# Patient Record
Sex: Female | Born: 1970 | Race: White | Hispanic: No | Marital: Married | Smoking: Never smoker
Health system: Southern US, Community
[De-identification: ages and names within clinical notes are randomized; demographics above are authoritative.]

## PROBLEM LIST (undated history)

## (undated) DIAGNOSIS — E669 Obesity, unspecified: Secondary | ICD-10-CM

## (undated) DIAGNOSIS — M1990 Unspecified osteoarthritis, unspecified site: Secondary | ICD-10-CM

## (undated) DIAGNOSIS — F32A Depression, unspecified: Secondary | ICD-10-CM

## (undated) DIAGNOSIS — K219 Gastro-esophageal reflux disease without esophagitis: Secondary | ICD-10-CM

## (undated) DIAGNOSIS — T4145XA Adverse effect of unspecified anesthetic, initial encounter: Secondary | ICD-10-CM

## (undated) DIAGNOSIS — T8859XA Other complications of anesthesia, initial encounter: Secondary | ICD-10-CM

## (undated) DIAGNOSIS — K5900 Constipation, unspecified: Secondary | ICD-10-CM

## (undated) DIAGNOSIS — N939 Abnormal uterine and vaginal bleeding, unspecified: Secondary | ICD-10-CM

## (undated) DIAGNOSIS — F329 Major depressive disorder, single episode, unspecified: Secondary | ICD-10-CM

## (undated) HISTORY — PX: ENDOMETRIAL ABLATION: SHX621

## (undated) HISTORY — PX: CHOLECYSTECTOMY: SHX55

## (undated) HISTORY — DX: Depression, unspecified: F32.A

## (undated) HISTORY — PX: TUBAL LIGATION: SHX77

## (undated) HISTORY — PX: ABDOMINOPLASTY: SUR9

## (undated) HISTORY — PX: GASTRIC BYPASS: SHX52

## (undated) HISTORY — PX: BREAST BIOPSY: SHX20

## (undated) HISTORY — PX: CORRECTION HAMMER TOE: SUR317

## (undated) HISTORY — PX: AUGMENTATION MAMMAPLASTY: SUR837

---
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
## 1998-08-29 ENCOUNTER — Other Ambulatory Visit: Admission: RE | Admit: 1998-08-29 | Discharge: 1998-08-29 | Payer: Self-pay | Admitting: Obstetrics and Gynecology

## 2000-10-10 ENCOUNTER — Emergency Department (HOSPITAL_COMMUNITY): Admission: EM | Admit: 2000-10-10 | Discharge: 2000-10-10 | Payer: Self-pay

## 2001-07-19 ENCOUNTER — Emergency Department (HOSPITAL_COMMUNITY): Admission: EM | Admit: 2001-07-19 | Discharge: 2001-07-19 | Payer: Self-pay | Admitting: Emergency Medicine

## 2002-05-31 ENCOUNTER — Other Ambulatory Visit: Admission: RE | Admit: 2002-05-31 | Discharge: 2002-05-31 | Payer: Self-pay | Admitting: Obstetrics and Gynecology

## 2004-01-27 ENCOUNTER — Other Ambulatory Visit: Admission: RE | Admit: 2004-01-27 | Discharge: 2004-01-27 | Payer: Self-pay | Admitting: Obstetrics and Gynecology

## 2004-02-16 ENCOUNTER — Inpatient Hospital Stay (HOSPITAL_COMMUNITY): Admission: AD | Admit: 2004-02-16 | Discharge: 2004-02-16 | Payer: Self-pay | Admitting: Obstetrics and Gynecology

## 2004-02-16 ENCOUNTER — Encounter (INDEPENDENT_AMBULATORY_CARE_PROVIDER_SITE_OTHER): Payer: Self-pay | Admitting: Specialist

## 2004-02-16 ENCOUNTER — Ambulatory Visit (HOSPITAL_COMMUNITY): Admission: RE | Admit: 2004-02-16 | Discharge: 2004-02-16 | Payer: Self-pay | Admitting: Obstetrics and Gynecology

## 2004-02-18 ENCOUNTER — Inpatient Hospital Stay (HOSPITAL_COMMUNITY): Admission: AD | Admit: 2004-02-18 | Discharge: 2004-02-18 | Payer: Self-pay | Admitting: Obstetrics and Gynecology

## 2004-08-09 ENCOUNTER — Ambulatory Visit (HOSPITAL_COMMUNITY): Admission: RE | Admit: 2004-08-09 | Discharge: 2004-08-09 | Payer: Self-pay | Admitting: Obstetrics and Gynecology

## 2005-02-08 ENCOUNTER — Emergency Department (HOSPITAL_COMMUNITY): Admission: EM | Admit: 2005-02-08 | Discharge: 2005-02-08 | Payer: Self-pay | Admitting: Emergency Medicine

## 2006-10-24 ENCOUNTER — Encounter (INDEPENDENT_AMBULATORY_CARE_PROVIDER_SITE_OTHER): Payer: Self-pay | Admitting: Specialist

## 2006-10-24 ENCOUNTER — Ambulatory Visit (HOSPITAL_COMMUNITY): Admission: RE | Admit: 2006-10-24 | Discharge: 2006-10-24 | Payer: Self-pay | Admitting: Obstetrics and Gynecology

## 2006-10-26 ENCOUNTER — Inpatient Hospital Stay (HOSPITAL_COMMUNITY): Admission: AD | Admit: 2006-10-26 | Discharge: 2006-10-26 | Payer: Self-pay | Admitting: Obstetrics and Gynecology

## 2007-09-01 ENCOUNTER — Inpatient Hospital Stay (HOSPITAL_COMMUNITY): Admission: AD | Admit: 2007-09-01 | Discharge: 2007-09-01 | Payer: Self-pay | Admitting: Obstetrics and Gynecology

## 2011-05-03 NOTE — Op Note (Signed)
Denise Summers, Denise Summers              ACCOUNT NO.:  [id number]   MEDICAL RECORD NO.:  [id number]          PATIENT TYPE:  AMB   LOCATION:  SDC                           FACILITY:  [facility]   PHYSICIAN:  Zenaida Niece, M.D.DATE OF BIRTH:  12-11-1971   DATE OF PROCEDURE:  10/24/2006  DATE OF DISCHARGE:                                 OPERATIVE REPORT   PREOPERATIVE DIAGNOSIS:  Abnormal uterine bleeding and endometrial lesion.   POSTOPERATIVE DIAGNOSIS:  Stenotic cervix with hematometra.   PROCEDURE:  Hysteroscopy with dilation and curettage.   SURGEON:  Lavina Hamman, MD   ANESTHESIA:  General with an LMA and paracervical block.   SPECIMENS:  Endometrial curettings sent to pathology.   ESTIMATED BLOOD LOSS:  Minimal.   COMPLICATIONS:  None.   FINDINGS:  The patient had a significantly stenotic cervix and necrotic  looking material in the endometrial cavity.   PROCEDURE IN DETAIL:  The patient was taken to the operating room and placed  in the dorsal supine position.  General anesthesia was induced, and she was  placed in mobile stirrups.  Perineum and vagina were then prepped and draped  in the usual sterile fashion.  The bladder was drained with a latex free  catheter.  A Graves speculum was inserted in the vagina and the anterior lip  of the cervix grasped with a single-tooth tenaculum.  Paracervical block was  then performed with a total of 16 mL of 2% plain lidocaine.  I attempted to  sound the uterus but was only able to get it to sound to approximately 4-5  cm consistent with cervical length.  I then tried to dilate and could not  get through the cervix.  The hysteroscope was inserted and confirmed  complete stenosis of the internal cervical os.  Small dilators were then  used.  I was able to eventually enter the endometrial cavity.  Inserting the  hysteroscope, I still was not able to get very good visualization.  Further  dilation was performed and still all I could see  with the hysteroscope was  what appeared to be a long tunnel.  Sharp curettage was then performed and  in doing this, apparently I broke through an adhesion and was able to get  old blood out of the uterus.  Hysteroscopy was then performed which revealed  a normal endometrial cavity with necrotic-appearing material, mostly on the  anterior portion.  Most of the lining appeared atrophic.  The hysteroscope  was removed and sharp curettage was performed again with return of a small  amount of tissue and some more old blood.  The single-tooth tenaculum was  removed  and bleeding controlled with pressure.  All instruments were then removed  from the vagina.  The patient was awakened in the operating and taken to the  recovery in stable condition after tolerating the procedure well.  Counts  were correct.      Zenaida Niece, M.D.  Electronically Signed     TDM/MEDQ  D:  10/24/2006  T:  10/24/2006  Job:  [id number]

## 2011-05-03 NOTE — H&P (Signed)
NAME:  Denise Summers, JOHNDROW                        ACCOUNT NO.:  [id number]   MEDICAL RECORD NO.:  [id number]                   PATIENT TYPE:  AMB   LOCATION:  DAY                                  FACILITY:  [facility]   PHYSICIAN:  Zenaida Niece, M.D.             DATE OF BIRTH:  August 04, 1971   DATE OF ADMISSION:  08/09/2004  DATE OF DISCHARGE:                                HISTORY & PHYSICAL   CHIEF COMPLAINT:  Left pelvic pain.   HISTORY OF PRESENT ILLNESS:  This is a 40 year old white female, para 1-0-2-  1, whom I performed a hysteroscopy with D&C and endometrial ablation with  _________device in March 2005.  That was done for abnormal bleeding and  dysmenorrhea.  Since then, she has had some recently pelvic cramping and  very light bleeding which is significantly improved.  However, at the time  of her first surgery she was also complaining of some left pelvic pain.  She  had nothing significant on examination at that point, and it was not felt to  be significant.  However, she continues to have intermittent unpredictable  left lower quadrant pain which is worse with coitus.  There is no associated  fever, chills, nausea, or vomiting.  She does have some urinary frequency  and normal bowel movements.  Due to the persistent left pelvic pain, we are  going to proceed with a diagnostic and possible operative laparoscopy to  evaluate her pelvis.   PAST OBSTETRICAL HISTORY:  1. One elective termination.  2. One spontaneous abortion for which she required a D&C,.  3. One spontaneous vaginal delivery, 7 pounds 14 ounces, without     complications.   PAST MEDICAL HISTORY:  Seizure disorder.  Currently, well controlled on  medications.   PAST SURGICAL HISTORY:  1. Laparoscopic cholecystectomy.  2. D&C.  3. Tubal ligation.  4. Hand surgery.  5. Recent hysteroscopy with endometrial ablation.   ALLERGIES:  DILANTIN.   CURRENT MEDICATIONS:  1. Zonegran.  2. Vicodin.   SOCIAL  HISTORY:  The patient is single and denies alcohol, tobacco, or drug  use.   FAMILY HISTORY:  No GYN or colon cancer.   PHYSICAL EXAMINATION:  VITAL SIGNS:  Weight is approximately 250 pounds,  vitals are stable.  GENERAL:  She is an obese white female in no acute distress.  NECK:  Supple without lymphadenopathy or thyromegaly.  LUNGS:  Clear to auscultation.  HEART:  Regular rate and rhythm without murmur.  ABDOMEN:  Benign.  Slightly tender bilateral lower quadrants without  palpable masses, and she does have laparoscopic scars.  PELVIC:  Bimanual examination shows a small mid-plantar to anteverted uterus  which is slightly tender, and both adnexa are slightly tender, left greater  than right.  EXTREMITIES:  No edema, are nontender.   ASSESSMENT:  Persistent left pelvic pain.  All medical and surgical options  have been discussed with the patient, and  she wishes to proceed with  laparoscopic evaluation.  Risks of surgery, including bleeding, infection,  and damage of the surrounding organs have been discussed with the patient.  She also understands that I may not find a reason for her pain.   PLAN:  Admit the patient for an open laparoscopy with possible adhesiolysis  or fulguration of endometriosis.                                               Zenaida Niece, M.D.    TDM/MEDQ  D:  08/08/2004  T:  08/08/2004  Job:  [id number]

## 2011-05-03 NOTE — H&P (Signed)
NAME:  Denise Summers, Denise Summers                        ACCOUNT NO.:  [id number]   MEDICAL RECORD NO.:  [id number]                   PATIENT TYPE:  AMB   LOCATION:  SDC                                  FACILITY:  [facility]   PHYSICIAN:  Zenaida Niece, M.D.             DATE OF BIRTH:  03-16-1971   DATE OF ADMISSION:  DATE OF DISCHARGE:                                HISTORY & PHYSICAL   CHIEF COMPLAINT:  Abnormal uterine bleeding and painful periods.   HISTORY OF PRESENT ILLNESS:  This is a 39 year old white female para 1, 0,  2, 1 whom I saw for an annual exam February 11th. At that time, she reported  regular menses but increased bleeding and pain with her last period which  was also very short.  Advil did not help the discomfort.  On exam, uterus  was anteverted with a possible left fundal myoma and was nontender.  She had  an ultrasound performed on February 16th which revealed a slightly large  uterus which 2 small myomas which did not appear to be submucosal. She had a  single right ovarian cyst and a simple left ovarian cyst.   Options had been discussed with the patient and she wishes to proceed with  hysteroscopy with D&C and endometrial ablation.   The patient is currently requiring Vicodin to control her pain and is also  on Provera to control her bleeding.   OBSTETRICAL HISTORY:  One elective termination, one spontaneous abortion for  which she required a D&C and 1 spontaneous vaginal delivery 7 pounds, 14  ounces without complications.   PAST MEDICAL HISTORY:  Seizure disorder which is currently well controlled  on medications.   PAST SURGICAL HISTORY:  Laparoscopic cholecystectomy December 2004.  D&C.  Also tubal ligation in 1996 with laparoscopy. Hand surgery.   ALLERGIES:  DILANTIN.   CURRENT MEDICATIONS:  Zonegran but was unable to give me the dose, Vicodin  p.r.n. and Provera 10 mg a day.   SOCIAL HISTORY:  Patient is single and denies alcohol, tobacco or drug  use.   FAMILY HISTORY:  No GYN or colon cancer.   PHYSICAL EXAMINATION:  VITAL SIGNS:  Weight 245 pounds, blood pressure  110/90, pulse 80.  GENERAL:  Patient is obese but in no acute distress.  NECK:  Supple without lymphadenopathy or thyromegaly.  LUNGS:  Clear to auscultation.  CARDIOVASCULAR:  Regular rate and rhythm without murmur.  ABDOMEN:  Obese, soft, nontender, nondistended without palpable masses. She  does have laparoscopic scars.  PELVIC:  External genitalia without lesions.  Speculum exam revealed a  normal cervix and Pap smear has returned within normal limits.  Bimanual  exam revealed an anteverted uterus with possible left fundal myoma and is  nontender. She has no adnexal masses.   ASSESSMENT:  Menorrhagia and dysmenorrhea with possible small fibroids by  ultrasound.  These do not appear to be submucosal. All medical and  surgical  options have been discussed with the patient.  Her endometrial stripe was  thickened on the ultrasound measuring 12.3 mm.  Thus, I have recommended  hysteroscopy with D&C and she wishes to proceed with endometrial ablation.   Risks of surgery including bleeding, infection, damage to surrounding organs  including uterine perforation have been discussed.   Plan is to admit the patient for a hysteroscopy with D&C followed by  endometrial ablation with the NovaSure device.                                               Zenaida Niece, M.D.    TDM/MEDQ  D:  02/15/2004  T:  02/15/2004  Job:  [id number]

## 2011-05-03 NOTE — Op Note (Signed)
NAME:  Denise Summers, Denise Summers                        ACCOUNT NO.:  [id number]   MEDICAL RECORD NO.:  [id number]                   PATIENT TYPE:  AMB   LOCATION:  DAY                                  FACILITY:  [facility]   PHYSICIAN:  Zenaida Niece, M.D.             DATE OF BIRTH:  27-Jul-1971   DATE OF PROCEDURE:  08/09/2004  DATE OF DISCHARGE:                                 OPERATIVE REPORT   PREOPERATIVE DIAGNOSIS:  Pelvic pain.   POSTOPERATIVE DIAGNOSES:  Pelvic pain and minimal pelvic adhesions.   PROCEDURE:  Open laparoscopy with minimal adhesiolysis.   SURGEON:  Lavina Hamman, M.D.   ANESTHESIA:  General endotracheal tube.   ESTIMATED BLOOD LOSS:  Less than 50 mL.   FINDINGS:  She had normal pelvic anatomy with evidence of prior tubal  ligation.  There were some filmy sigmoid adhesions to the left  pelvic/abdominal wall.  There was one twisted and necrotic appendiceal  epiploica on the sigmoid colon.  Appendix was normal.  Liver edge was also  normal.   PROCEDURE IN DETAIL:  The patient was taken to the operating room and placed  in the dorsal supine position.  General anesthesia was induced, and she was  placed in mobile stirrups.  The abdomen was then prepped and draped in the  usual sterile fashion, bladder drained with a red rubber catheter, Hulka  tenaculum applied to the cervix for uterine manipulation.  Infraumbilical  skin was then infiltrated with 0.25% Marcaine, and a 3 cm horizontal  incision was made along her previous scar.  Dissection was carried out down  to the fascia which was elevated and entered sharply.  This was extended,  and I was then able to enter the peritoneal cavity bluntly.  Sweeping with a  finger, I felt no significant adhesions.  A pursestring suture of 0 Vicryl  was placed around the fascial edge and secured to a Hasson cannula which was  inserted through the incision.  CO2 gas was insufflated, and the patient was  placed in Trendelenburg.   The laparoscope was introduced.  A 5 mm port was  then placed low in the midline under direct visualization.  Inspection  revealed the above-mentioned findings.  Normal tubes and ovaries with  evidence of prior tubal ligation.  Uterus was slightly enlarged but  otherwise normal.  There was no evidence of significant pelvic pathology  including no evidence of endometriosis or significant pelvic adhesions or  infection.  Some filmy adhesions of the sigmoid colon to the left pelvic  abdominal wall were taken down with sharp and blunt dissection.  There was  noted to be a twisted necrotic appendiceal epiploica on the left side.  Appendix appeared normal, and liver edge was normal.  No further source for  her pain was identified.  The 5 mm port was then removed under direct  visualization.  The laparoscope was removed followed by the umbilical  trocar  and all gas allowed to deflate from the abdomen.  The previously placed  pursestring suture was tied, and this achieved good fascial integrity.  Skin  incisions were closed with subcuticular sutures of 4-0 Vicryl followed by  Steri-Strips and Band-Aids.  The Hulka  tenaculum was then removed from the cervix.  The patient tolerated the  procedure well and was taken to the recovery room in stable condition.  Counts were correct.  She received no antibiotics.  She had PAS hose on  throughout the procedure.                                               Zenaida Niece, M.D.    TDM/MEDQ  D:  08/09/2004  T:  08/09/2004  Job:  [id number]

## 2011-05-03 NOTE — Op Note (Signed)
NAME:  Denise Summers, Denise Summers                        ACCOUNT NO.:  [id number]   MEDICAL RECORD NO.:  [id number]                   PATIENT TYPE:  AMB   LOCATION:  SDC                                  FACILITY:  [facility]   PHYSICIAN:  Zenaida Niece, M.D.             DATE OF BIRTH:  [birth year]   DATE OF PROCEDURE:  02/16/2004  DATE OF DISCHARGE:                                 OPERATIVE REPORT   DIAGNOSES:  1. Abnormal uterine bleeding.  2. Dysmenorrhea.   PROCEDURE:  Hysteroscopy with dilation and curettage and endometrial  ablation with the NovaSure device.   SURGEON:  Zenaida Niece, M.D.   ANESTHESIA:  Monitored anesthesia care and paracervical block.   ESTIMATED BLOOD LOSS:  Less than 50 mL.   FLUID DEFICIT THROUGH THE HYSTEROSCOPE:  Approximately 30 mL.   FINDINGS:  She had a shaggy endometrium but otherwise normal endometrial  cavity.  Curettage did reveal moderate tissue.  With the NovaSure device,  the uterus measured 5 cm in length, 4.5 cm in width, and used under 24 watts  for 58 seconds.   PROCEDURE IN DETAIL:  The patient was taken to the operating room and placed  in the dorsal supine position.  She was given IV sedation and placed in  mobile stirrups.  Perineum and vagina were prepped and draped in the usual  sterile fashion and bladder drained with a red rubber catheter.  Graves  speculum was inserted in the vagina, and the anterior lip of the cervix  grasped with a single-tooth tenaculum.  Paracervical block was then  performed with 10 mL of 2% lidocaine.  The uterus sounded to 9.5 cm; cervix  measured 4.5 cm.  The cervix was then gradually dilated to a size 23  dilator.  The observer hysteroscope was inserted, and adequate visualization  was achieved.  There were no specific lesions other than shaggy endometrium.  The uterine cornu and fundus appeared normal.  The hysteroscope was removed,  and sharp curettage performed with return of a moderate amount of tissue.  The NovaSure device was then prepared appropriately and adjusted to 5 cm  uterine length.  It was then inserted and deployed appropriately.  It had  passed the CO2 test, and endometrial ablation was performed with the above-  mentioned settings without complications.  The device was removed intact.  Single-tooth tenaculum was removed and bleeding controlled with pressure.  All instruments were then removed from the vagina.  The patient was awakened  in the operating room, tolerated the procedure well, and was taken to the  recovery room in stable condition.  Counts were correct x 2, and she  received no antibiotics.  Fredderick Erb with the NovaSure device was present  throughout the endometrial ablation.  Zenaida Niece, M.D.    TDM/MEDQ  D:  02/16/2004  T:  02/16/2004  Job:  [id number]

## 2011-09-26 LAB — URINALYSIS, ROUTINE W REFLEX MICROSCOPIC
Glucose, UA: NEGATIVE
Ketones, ur: NEGATIVE
Leukocytes, UA: NEGATIVE
Protein, ur: NEGATIVE
Urobilinogen, UA: 0.2

## 2011-09-26 LAB — CBC
Hemoglobin: 13.7
MCV: 95.4
RBC: 4.26
WBC: 6.4

## 2011-09-26 LAB — URINE MICROSCOPIC-ADD ON

## 2012-01-08 ENCOUNTER — Other Ambulatory Visit: Payer: Self-pay | Admitting: Obstetrics and Gynecology

## 2012-01-08 DIAGNOSIS — R928 Other abnormal and inconclusive findings on diagnostic imaging of breast: Secondary | ICD-10-CM

## 2012-01-24 ENCOUNTER — Ambulatory Visit
Admission: RE | Admit: 2012-01-24 | Discharge: 2012-01-24 | Disposition: A | Discharge status: Home / Self Care | Payer: 59 | Source: Ambulatory Visit | Attending: Obstetrics and Gynecology | Admitting: Obstetrics and Gynecology

## 2012-01-24 DIAGNOSIS — R928 Other abnormal and inconclusive findings on diagnostic imaging of breast: Secondary | ICD-10-CM

## 2013-04-20 ENCOUNTER — Encounter (HOSPITAL_BASED_OUTPATIENT_CLINIC_OR_DEPARTMENT_OTHER): Payer: Self-pay

## 2013-04-20 ENCOUNTER — Emergency Department (HOSPITAL_BASED_OUTPATIENT_CLINIC_OR_DEPARTMENT_OTHER)
Admission: EM | Admit: 2013-04-20 | Discharge: 2013-04-20 | Discharge status: Against Medical Advice | Payer: BC Managed Care – PPO | Attending: Emergency Medicine | Admitting: Emergency Medicine

## 2013-04-20 DIAGNOSIS — IMO0002 Reserved for concepts with insufficient information to code with codable children: Secondary | ICD-10-CM | POA: Insufficient documentation

## 2013-04-20 DIAGNOSIS — Y92410 Unspecified street and highway as the place of occurrence of the external cause: Secondary | ICD-10-CM | POA: Insufficient documentation

## 2013-04-20 DIAGNOSIS — Y9389 Activity, other specified: Secondary | ICD-10-CM | POA: Insufficient documentation

## 2013-04-20 HISTORY — DX: Obesity, unspecified: E66.9

## 2013-04-20 NOTE — ED Notes (Signed)
Pt states that she has been waiting more than 30 minutes and that this is unacceptable.  Pt states that she cannot "believe that I have been sitting here this long and no one has come into my room."  Pt was informed that she would be seen shortly, however, since this is an emergency department the sickest patients are the ones who are seen first.  She was advised that the physician should be with her in an estimated 10-20 minutes provided that no one else needed emergent attention.  The patient was asked for her patience and cooperation, however she has decided to leave the department without being seen.
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
## 2013-12-10 ENCOUNTER — Encounter (HOSPITAL_COMMUNITY): Payer: Self-pay | Admitting: Emergency Medicine

## 2013-12-10 ENCOUNTER — Emergency Department (HOSPITAL_COMMUNITY)
Admission: EM | Admit: 2013-12-10 | Discharge: 2013-12-10 | Disposition: A | Discharge status: Home / Self Care | Payer: Managed Care, Other (non HMO) | Attending: Emergency Medicine | Admitting: Emergency Medicine

## 2013-12-10 DIAGNOSIS — R6883 Chills (without fever): Secondary | ICD-10-CM | POA: Insufficient documentation

## 2013-12-10 DIAGNOSIS — B9789 Other viral agents as the cause of diseases classified elsewhere: Secondary | ICD-10-CM | POA: Insufficient documentation

## 2013-12-10 DIAGNOSIS — R197 Diarrhea, unspecified: Secondary | ICD-10-CM

## 2013-12-10 DIAGNOSIS — B349 Viral infection, unspecified: Secondary | ICD-10-CM

## 2013-12-10 DIAGNOSIS — Z79899 Other long term (current) drug therapy: Secondary | ICD-10-CM | POA: Insufficient documentation

## 2013-12-10 DIAGNOSIS — J3489 Other specified disorders of nose and nasal sinuses: Secondary | ICD-10-CM | POA: Insufficient documentation

## 2013-12-10 DIAGNOSIS — R109 Unspecified abdominal pain: Secondary | ICD-10-CM | POA: Insufficient documentation

## 2013-12-10 DIAGNOSIS — Z9851 Tubal ligation status: Secondary | ICD-10-CM | POA: Insufficient documentation

## 2013-12-10 DIAGNOSIS — E669 Obesity, unspecified: Secondary | ICD-10-CM | POA: Insufficient documentation

## 2013-12-10 DIAGNOSIS — R5381 Other malaise: Secondary | ICD-10-CM | POA: Insufficient documentation

## 2013-12-10 DIAGNOSIS — Z9889 Other specified postprocedural states: Secondary | ICD-10-CM | POA: Insufficient documentation

## 2013-12-10 DIAGNOSIS — R110 Nausea: Secondary | ICD-10-CM | POA: Insufficient documentation

## 2013-12-10 DIAGNOSIS — Z9089 Acquired absence of other organs: Secondary | ICD-10-CM | POA: Insufficient documentation

## 2013-12-10 MED ORDER — ONDANSETRON HCL 4 MG PO TABS: 4.0000 mg | ORAL_TABLET | Freq: Four times a day (QID) | ORAL | Status: DC

## 2013-12-10 MED ORDER — ONDANSETRON 4 MG PO TBDP
4.0000 mg | ORAL_TABLET | Freq: Once | ORAL | Status: AC
  Administered 2013-12-10: 4 mg via ORAL
  Filled 2013-12-10: qty 1

## 2013-12-10 MED ORDER — DIPHENOXYLATE-ATROPINE 2.5-0.025 MG PO TABS
1.0000 | ORAL_TABLET | Freq: Once | ORAL | Status: AC
  Administered 2013-12-10: 1 via ORAL
  Filled 2013-12-10: qty 1

## 2013-12-10 MED ORDER — DIPHENOXYLATE-ATROPINE 2.5-0.025 MG PO TABS: 1.0000 | ORAL_TABLET | Freq: Four times a day (QID) | ORAL | Status: DC | PRN

## 2013-12-10 NOTE — ED Notes (Signed)
Pt states diarrhea x 2 days, denies vomiting, abdominal pain, states feels weak

## 2013-12-10 NOTE — ED Provider Notes (Signed)
CSN: [id number]     Arrival date & time 12/10/13  1235 History   First MD Initiated Contact with Patient 12/10/13 1435     Chief Complaint  Patient presents with  . Diarrhea   (Consider location/radiation/quality/duration/timing/severity/associated sxs/prior Treatment) HPI Comments: Patient is a 42 year old obese female who presents to the emergency department complaining of diarrhea x2 days. Diarrhea is nonbloody, nonbilious. She has had multiple episodes of diarrhea. Admits to associated weakness, chills, congestion nausea without vomiting. 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 She feels fun down. Denies abdominal pain, vomiting, fever, cough, sob.   Patient is a 42 y.o. female presenting with diarrhea. The history is provided by the patient.  Diarrhea Associated symptoms: chills     Past Medical History  Diagnosis Date  . Obesity    Past Surgical History  Procedure Laterality Date  . Cholecystectomy    . Tubal ligation    . Endometrial ablation     History reviewed. No pertinent family history. History  Substance Use Topics  . Smoking status: Never Smoker   . Smokeless tobacco: Never Used  . Alcohol Use: No   OB History   Grav Para Term Preterm Abortions TAB SAB Ect Mult Living                 Review of Systems  Constitutional: Positive for chills.  HENT: Positive for congestion.   Gastrointestinal: Positive for nausea and diarrhea.  Neurological: Positive for weakness.  All other systems reviewed and are negative.    Allergies  Review of patient's allergies indicates no known allergies.  Home Medications   Current Outpatient Rx  Name  Route  Sig  Dispense  Refill  . acetaminophen (TYLENOL) 500 MG tablet   Oral   Take 1,000 mg by mouth every 6 (six) hours as needed.         . diphenoxylate-atropine (LOMOTIL) 2.5-0.025 MG per tablet   Oral   Take 1 tablet by mouth 4 (four) times daily as needed for diarrhea or loose  stools.   15 tablet   0   . ondansetron (ZOFRAN) 4 MG tablet   Oral   Take 1 tablet (4 mg total) by mouth every 6 (six) hours.   12 tablet   0    BP 122/62  Temp(Src) 98.2 F (36.8 C) (Oral)  Resp 18  SpO2 100% Physical Exam  Nursing note and vitals reviewed. Constitutional: She is oriented to person, place, and time. She appears well-developed and well-nourished. No distress.  HENT:  Head: Normocephalic and atraumatic.  Mouth/Throat: Oropharynx is clear and moist.  Eyes: Conjunctivae are normal. No scleral icterus.  Neck: Normal range of motion. Neck supple.  Cardiovascular: Normal rate, regular rhythm and normal heart sounds.   Pulmonary/Chest: Effort normal and breath sounds normal.  Abdominal: Soft. Bowel sounds are normal. She exhibits no distension and no mass. There is tenderness. There is no rebound and no guarding.  Mild tenderness to lower abdomen, no peritoneal signs.  Musculoskeletal: Normal range of motion. She exhibits no edema.  Neurological: She is alert and oriented to person, place, and time.  Skin: Skin is warm and dry. She is not diaphoretic.  Psychiatric: She has a normal mood and affect. Her behavior is normal.    ED Course  Procedures (including critical care time) Labs Review Labs Reviewed - No data to display Imaging Review No results found.  EKG Interpretation   None  MDM   1. Diarrhea   2. Viral syndrome    Patient presenting with diarrhea, generalized weakness, congestion and nausea. She is well appearing and in no apparent distress, normal vital signs. 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 Abdomen has mild tenderness, soft, no peritoneal signs. No recent antibiotic use. Will treat symptoms. She is stable for discharge. Return precautions given. Patient states understanding of treatment care plan and is agreeable.     Trevor Mace, PA-C 12/10/13 [phone]

## 2013-12-12 ENCOUNTER — Encounter (HOSPITAL_COMMUNITY): Payer: Self-pay | Admitting: Emergency Medicine

## 2013-12-12 ENCOUNTER — Emergency Department (HOSPITAL_COMMUNITY)
Admission: EM | Admit: 2013-12-12 | Discharge: 2013-12-12 | Disposition: A | Discharge status: Home / Self Care | Payer: Managed Care, Other (non HMO) | Attending: Emergency Medicine | Admitting: Emergency Medicine

## 2013-12-12 DIAGNOSIS — J069 Acute upper respiratory infection, unspecified: Secondary | ICD-10-CM | POA: Insufficient documentation

## 2013-12-12 DIAGNOSIS — R5381 Other malaise: Secondary | ICD-10-CM | POA: Insufficient documentation

## 2013-12-12 DIAGNOSIS — E669 Obesity, unspecified: Secondary | ICD-10-CM | POA: Insufficient documentation

## 2013-12-12 MED ORDER — DIPHENHYDRAMINE HCL 25 MG PO CAPS
25.0000 mg | ORAL_CAPSULE | Freq: Once | ORAL | Status: AC
  Administered 2013-12-12: 25 mg via ORAL
  Filled 2013-12-12: qty 1

## 2013-12-12 MED ORDER — KETOROLAC TROMETHAMINE 30 MG/ML IJ SOLN
30.0000 mg | Freq: Once | INTRAMUSCULAR | Status: AC
  Administered 2013-12-12: 30 mg via INTRAMUSCULAR
  Filled 2013-12-12: qty 1

## 2013-12-12 MED ORDER — METOCLOPRAMIDE HCL 5 MG/ML IJ SOLN
10.0000 mg | Freq: Once | INTRAMUSCULAR | Status: AC
  Administered 2013-12-12: 10 mg via INTRAMUSCULAR
  Filled 2013-12-12: qty 2

## 2013-12-12 NOTE — ED Provider Notes (Signed)
Medical screening examination/treatment/procedure(s) were performed by non-physician practitioner and as supervising physician I was immediately available for consultation/collaboration.  EKG Interpretation   None        Ethelda Chick, MD 12/12/13 1207

## 2013-12-12 NOTE — ED Notes (Signed)
Per pt, states flu like symptoms since last Thurs-no more diarrhea although still has congestion, weakness

## 2013-12-12 NOTE — ED Provider Notes (Signed)
CSN: [id number]     Arrival date & time 12/12/13  7829 History   First MD Initiated Contact with Patient 12/12/13 1023     Chief Complaint  Patient presents with  . flu like symptoms    (Consider location/radiation/quality/duration/timing/severity/associated sxs/prior Treatment) HPI Comments: Patient presents again to the emergency department with complaint of headache, fatigue, shortness of breath, mild cough at night, nasal congestion. Patient was seen in emergency department 2 days ago with similar symptoms and diarrhea. At that time, she was given Lomotil which has helped her diarrhea. Other symptoms continue. She denies fever or chest pain. Patient states that she becomes very short of breath even with walking short distances. She denies risk factors for PE. She denies lower extremity swelling. Cough is nonproductive. Headache is generalized. She denies weakness in her arms or her legs, blurry vision, vomiting. Nothing makes the headache better or worse. She did not hit her head. Onset was gradual.  The history is provided by the patient and medical records.    Past Medical History  Diagnosis Date  . Obesity    Past Surgical History  Procedure Laterality Date  . Cholecystectomy    . Tubal ligation    . Endometrial ablation     No family history on file. History  Substance Use Topics  . Smoking status: Never Smoker   . Smokeless tobacco: Never Used  . Alcohol Use: No   OB History   Grav Para Term Preterm Abortions TAB SAB Ect Mult Living                 Review of Systems  Constitutional: Positive for fatigue. Negative for fever and chills.  HENT: Positive for congestion. Negative for dental problem, ear pain, rhinorrhea, sinus pressure and sore throat.   Eyes: Negative for photophobia, discharge, redness and visual disturbance.  Respiratory: Positive for cough and shortness of breath. Negative for wheezing.   Cardiovascular: Negative for chest pain.  Gastrointestinal:  Negative for nausea, vomiting, abdominal pain and diarrhea.  Genitourinary: Negative for dysuria.  Musculoskeletal: Negative for gait problem, myalgias, neck pain and neck stiffness.  Skin: Negative for rash.  Neurological: Positive for headaches. Negative for syncope, speech difficulty, weakness, light-headedness and numbness.  Hematological: Negative for adenopathy.  Psychiatric/Behavioral: Negative for confusion.    Allergies  Review of patient's allergies indicates no known allergies.  Home Medications   Current Outpatient Rx  Name  Route  Sig  Dispense  Refill  . acetaminophen (TYLENOL) 500 MG tablet   Oral   Take 1,000 mg by mouth every 6 (six) hours as needed for mild pain or moderate pain.          . diphenoxylate-atropine (LOMOTIL) 2.5-0.025 MG per tablet   Oral   Take 1 tablet by mouth 4 (four) times daily as needed for diarrhea or loose stools.         . ondansetron (ZOFRAN) 4 MG tablet   Oral   Take 1 tablet (4 mg total) by mouth every 6 (six) hours.   12 tablet   0    BP 119/59  Pulse 70  Temp(Src) 98 F (36.7 C) (Oral)  Resp 14  SpO2 100% Physical Exam  Nursing note and vitals reviewed. Constitutional: She is oriented to person, place, and time. She appears well-developed and well-nourished.  HENT:  Head: Normocephalic and atraumatic.  Right Ear: Tympanic membrane, external ear and ear canal normal.  Left Ear: Tympanic membrane, external ear and ear canal normal.  Nose: Nose normal. No mucosal edema or rhinorrhea.  Mouth/Throat: Uvula is midline, oropharynx is clear and moist and mucous membranes are normal. Mucous membranes are not dry. No oral lesions. No trismus in the jaw. No uvula swelling. No oropharyngeal exudate, posterior oropharyngeal edema, posterior oropharyngeal erythema or tonsillar abscesses.  Eyes: Conjunctivae, EOM and lids are normal. Pupils are equal, round, and reactive to light. Right eye exhibits no discharge. Left eye exhibits no  discharge. Right eye exhibits no nystagmus. Left eye exhibits no nystagmus.  Neck: Normal range of motion. Neck supple.  Cardiovascular: Normal rate, regular rhythm and normal heart sounds.   Pulmonary/Chest: Effort normal and breath sounds normal. No respiratory distress. She has no wheezes. She has no rales.  Abdominal: Soft. There is no tenderness.  Musculoskeletal:       Cervical back: She exhibits normal range of motion, no tenderness and no bony tenderness.  Lymphadenopathy:    She has no cervical adenopathy.  Neurological: She is alert and oriented to person, place, and time. She has normal strength and normal reflexes. No cranial nerve deficit or sensory deficit. She displays a negative Romberg sign. Coordination and gait normal. GCS eye subscore is 4. GCS verbal subscore is 5. GCS motor subscore is 6.  Skin: Skin is warm and dry.  Psychiatric: She has a normal mood and affect.    ED Course  Procedures (including critical care time) Labs Review Labs Reviewed - No data to display Imaging Review No results found.  EKG Interpretation   None      11:03 AM Patient seen and examined. Work-up initiated. Medications ordered. Cup of water given and she is encouraged to hydrate. Will ambulate to ensure she is not desatting with walking.   Vital signs reviewed and are as follows: Filed Vitals:   12/12/13 1003  BP: 119/59  Pulse: 70  Temp: 98 F (36.7 C)  Resp: 14   11:35 AM Patient ambulated with pulse ox and remained at 100%.   12:00 PM Pt feeling a bit better. Patient counseled on supportive care for viral syndrome and s/s to return including worsening symptoms, persistent fever, persistent vomiting, or if they have any other concerns.  Urged to see PCP if symptoms persist for more than 3 days. Patient verbalizes understanding and agrees with plan.     MDM   1. Upper respiratory infection    Patient with symptoms consistent with a probable viral syndrome.  Vitals are  stable, no fever.  No signs of dehydration.  Lung exam normal, no signs of pneumonia.  She does have URI symptoms. Supportive therapy indicated with return if symptoms worsen.  Patient has ambulated without desaturation.   HA: No head injury, gradual onset. Normal neurological exam. Do not suspect intracranial etiology. No fever or neck pain to suggest meningitis. Patient appears well, nontoxic. No indication for imaging at this time.      Renne Crigler, PA-C 12/12/13 1205

## 2013-12-16 NOTE — ED Provider Notes (Signed)
Medical screening examination/treatment/procedure(s) were performed by non-physician practitioner and as supervising physician I was immediately available for consultation/collaboration.  EKG Interpretation   None         Sartaj Hoskin H Mesiah Manzo, MD 12/16/13 1455 

## 2014-05-12 ENCOUNTER — Other Ambulatory Visit: Payer: Self-pay | Admitting: Gastroenterology

## 2014-05-12 DIAGNOSIS — R1310 Dysphagia, unspecified: Secondary | ICD-10-CM

## 2014-05-17 ENCOUNTER — Other Ambulatory Visit: Payer: Managed Care, Other (non HMO)

## 2014-05-23 ENCOUNTER — Encounter (INDEPENDENT_AMBULATORY_CARE_PROVIDER_SITE_OTHER): Payer: Self-pay

## 2014-05-23 ENCOUNTER — Other Ambulatory Visit: Payer: Managed Care, Other (non HMO)

## 2014-05-23 ENCOUNTER — Ambulatory Visit
Admission: RE | Admit: 2014-05-23 | Discharge: 2014-05-23 | Disposition: A | Discharge status: Home / Self Care | Payer: Managed Care, Other (non HMO) | Source: Ambulatory Visit | Attending: Gastroenterology | Admitting: Gastroenterology

## 2014-05-23 DIAGNOSIS — R1310 Dysphagia, unspecified: Secondary | ICD-10-CM

## 2014-06-07 ENCOUNTER — Other Ambulatory Visit: Payer: Self-pay | Admitting: Gastroenterology

## 2014-07-01 ENCOUNTER — Encounter (HOSPITAL_COMMUNITY): Payer: Self-pay | Admitting: *Deleted

## 2014-07-05 ENCOUNTER — Encounter (HOSPITAL_COMMUNITY): Payer: Self-pay | Admitting: Pharmacy Technician

## 2014-07-10 ENCOUNTER — Emergency Department: Payer: Self-pay | Admitting: Emergency Medicine

## 2014-07-14 ENCOUNTER — Ambulatory Visit (HOSPITAL_COMMUNITY)
Admission: RE | Admit: 2014-07-14 | Discharge: 2014-07-14 | Disposition: A | Discharge status: Home / Self Care | Payer: Managed Care, Other (non HMO) | Source: Ambulatory Visit | Attending: Gastroenterology | Admitting: Gastroenterology

## 2014-07-14 ENCOUNTER — Ambulatory Visit (HOSPITAL_COMMUNITY): Payer: Managed Care, Other (non HMO) | Admitting: Anesthesiology

## 2014-07-14 ENCOUNTER — Encounter (HOSPITAL_COMMUNITY): Payer: Managed Care, Other (non HMO) | Admitting: Anesthesiology

## 2014-07-14 ENCOUNTER — Encounter (HOSPITAL_COMMUNITY)
Admission: RE | Disposition: A | Discharge status: Home / Self Care | Payer: Self-pay | Source: Ambulatory Visit | Attending: Gastroenterology

## 2014-07-14 ENCOUNTER — Encounter (HOSPITAL_COMMUNITY): Payer: Self-pay

## 2014-07-14 DIAGNOSIS — R1310 Dysphagia, unspecified: Secondary | ICD-10-CM | POA: Insufficient documentation

## 2014-07-14 DIAGNOSIS — K319 Disease of stomach and duodenum, unspecified: Secondary | ICD-10-CM | POA: Insufficient documentation

## 2014-07-14 DIAGNOSIS — K289 Gastrojejunal ulcer, unspecified as acute or chronic, without hemorrhage or perforation: Secondary | ICD-10-CM | POA: Insufficient documentation

## 2014-07-14 DIAGNOSIS — K219 Gastro-esophageal reflux disease without esophagitis: Secondary | ICD-10-CM | POA: Insufficient documentation

## 2014-07-14 DIAGNOSIS — Z79899 Other long term (current) drug therapy: Secondary | ICD-10-CM | POA: Insufficient documentation

## 2014-07-14 DIAGNOSIS — Z9089 Acquired absence of other organs: Secondary | ICD-10-CM | POA: Insufficient documentation

## 2014-07-14 HISTORY — PX: ESOPHAGOGASTRODUODENOSCOPY (EGD) WITH PROPOFOL: SHX5813

## 2014-07-14 HISTORY — DX: Gastro-esophageal reflux disease without esophagitis: K21.9

## 2014-07-14 SURGERY — ESOPHAGOGASTRODUODENOSCOPY (EGD) WITH PROPOFOL
Anesthesia: Monitor Anesthesia Care

## 2014-07-14 MED ORDER — PROMETHAZINE HCL 25 MG/ML IJ SOLN: 6.2500 mg | INTRAMUSCULAR | Status: DC | PRN

## 2014-07-14 MED ORDER — LACTATED RINGERS IV SOLN
INTRAVENOUS | Status: DC | PRN
  Administered 2014-07-14: 08:00:00 via INTRAVENOUS

## 2014-07-14 MED ORDER — LACTATED RINGERS IV SOLN: INTRAVENOUS | Status: DC

## 2014-07-14 MED ORDER — PROPOFOL 10 MG/ML IV BOLUS
INTRAVENOUS | Status: AC
  Filled 2014-07-14: qty 20

## 2014-07-14 MED ORDER — PROPOFOL 10 MG/ML IV BOLUS
INTRAVENOUS | Status: DC | PRN
  Administered 2014-07-14 (×2): 40 mg via INTRAVENOUS
  Administered 2014-07-14: 20 mg via INTRAVENOUS
  Administered 2014-07-14 (×2): 40 mg via INTRAVENOUS
  Administered 2014-07-14: 20 mg via INTRAVENOUS
  Administered 2014-07-14: 30 mg via INTRAVENOUS

## 2014-07-14 MED ORDER — SODIUM CHLORIDE 0.9 % IV SOLN: INTRAVENOUS | Status: DC

## 2014-07-14 MED ORDER — PROPOFOL INFUSION 10 MG/ML OPTIME
INTRAVENOUS | Status: DC | PRN
  Administered 2014-07-14: 100 ug/kg/min via INTRAVENOUS

## 2014-07-14 MED ORDER — LACTATED RINGERS IV SOLN
INTRAVENOUS | Status: DC
  Administered 2014-07-14: 1000 mL via INTRAVENOUS

## 2014-07-14 SURGICAL SUPPLY — 14 items

## 2014-07-14 NOTE — Transfer of Care (Signed)
Immediate Anesthesia Transfer of Care Note  Patient: Denise MassonSara G Summers  Procedure(s) Performed: Procedure(s) (LRB): ESOPHAGOGASTRODUODENOSCOPY (EGD) WITH PROPOFOL (N/A)  Patient Location: PACU  Anesthesia Type: MAC  Level of Consciousness: sedated, patient cooperative and responds to stimulation  Airway & Oxygen Therapy: Patient Spontanous Breathing and Patient connected to face mask oxgen  Post-op Assessment: Report given to PACU RN and Post -op Vital signs reviewed and stable  Post vital signs: Reviewed and stable  Complications: No apparent anesthesia complications

## 2014-07-14 NOTE — Anesthesia Preprocedure Evaluation (Addendum)
Anesthesia Evaluation  Patient identified by MRN, date of birth, ID band Patient awake    Reviewed: Allergy & Precautions, H&P , NPO status , Patient's Chart, lab work & pertinent test results  Airway Mallampati: II TM Distance: >3 FB Neck ROM: Full    Dental  (+) Teeth Intact, Dental Advisory Given   Pulmonary neg pulmonary ROS,  breath sounds clear to auscultation  Pulmonary exam normal       Cardiovascular negative cardio ROS      Neuro/Psych negative neurological ROS  negative psych ROS   GI/Hepatic negative GI ROS, Neg liver ROS, GERD-  ,  Endo/Other  Morbid obesity  Renal/GU negative Renal ROS  negative genitourinary   Musculoskeletal negative musculoskeletal ROS (+)   Abdominal (+) + obese,   Peds negative pediatric ROS (+)  Hematology negative hematology ROS (+)   Anesthesia Other Findings   Reproductive/Obstetrics negative OB ROS                           Anesthesia Physical Anesthesia Plan  ASA: III  Anesthesia Plan: MAC   Post-op Pain Management:    Induction: Intravenous  Airway Management Planned: Nasal Cannula  Additional Equipment:   Intra-op Plan:   Post-operative Plan:   Informed Consent: I have reviewed the patients History and Physical, chart, labs and discussed the procedure including the risks, benefits and alternatives for the proposed anesthesia with the patient or authorized representative who has indicated his/her understanding and acceptance.   Dental advisory given  Plan Discussed with: CRNA  Anesthesia Plan Comments:         Anesthesia Quick Evaluation

## 2014-07-14 NOTE — Anesthesia Postprocedure Evaluation (Signed)
Anesthesia Post Note  Patient: Denise MassonSara G Summers  Procedure(s) Performed: Procedure(s) (LRB): ESOPHAGOGASTRODUODENOSCOPY (EGD) WITH PROPOFOL (N/A)  Anesthesia type: MAC  Patient location: PACU  Post pain: Pain level controlled  Post assessment: Post-op Vital signs reviewed  Last Vitals:  Filed Vitals:   07/14/14 0920  BP: 132/69  Pulse: 54  Temp:   Resp: 18    Post vital signs: Reviewed  Level of consciousness: sedated  Complications: No apparent anesthesia complications

## 2014-07-14 NOTE — Op Note (Signed)
[facility] [address] [place], [id number]   OPERATIVE PROCEDURE REPORT  PATIENT :Denise Summers, Denise G.  MR#: [id number] BIRTHDATE :09/02/1971 GENDER: Female ENDOSCOPIST:  Lorenza BurtonJyothi N Alona Danford, MD ASSISTANT:   Beryle BeamsJanie Billups, technician PROCEDURE DATE: 07/14/2014 PRE-PROCEDURE PREPERATION: Patient fasted for 8 hours prior to the procedure. PRE-PROCEDURE PHYSICAL: Patient has stable vital signs.  Neck is supple.  There is no JVD, thyromegaly or LAD.  Chest clear to auscultation.  S1 and S2 regular.  Abdomen soft, obese, non-distended, non-tender with NABS. PROCEDURE:     EGD w/ biopsy ASA CLASS:     Class III INDICATIONS:     Dysphagia and GERD.Marland Kitchen. MEDICATIONS:     Propofol (Diprivan) 300mg  IV TOPICAL ANESTHETIC:   none given.  DESCRIPTION OF PROCEDURE: After the risks benefits and alternatives of the procedure were thoroughly explained, informed consent was obtained.  The Pentax Gastroscope [id number]  was introduced through the mouth and advanced to the second portion of the duodenum , without limitations. The instrument was slowly withdrawn as the mucosa was fully examined.   The esophagus and the GEJ were widely patyent with linear furrows along the entire length of the esophagus consistent with Eosinophilic esophagitis. Multiple biopsies were done from the proximal and distal esophagus. A small paillloma was also removed from the mid-esophagus with cold biopsy x 1. Multiple antral erosions were noted and multiple biopsies were done. The rest of the stomach and the proximal small bowel appeared normal. There were no ulcers, masses or polyps noted. Retroflexed views revealed a small hiatal hernia. The scope was then withdrawn from the patient and the procedure terminated. The patient tolerated the procedure without immediate complications.     IMPRESSION:  1) Linear furrows along the entire length of the esophagus, most consistent with EoE-Biopsies done. 2)  Small hiatal hernia noted on retroflexion. 3) Multiple antral eriosons-biopsied for pathology. 4) Normal proximal small bowel.  RECOMMENDATIONS: 1) Await pathology results. 2) Continue PPI's. 3) Start Flovent inhaler PO BID-swallow. 4) Avoid all NSAIDS for the next 4 weeks. 5) OP follow up in 2 weeks. .   REPEAT EXAM:  No recall for now.  DISCHARGE INSTRUCTIONS: standard discharge instructions given _______________________________ eSigned:  Dr. Lorenza BurtonJyothi N Janey Petron, MD 07/14/2014 9:02 AM   CPT CODES:     [id number], EGD with biopsy  DIAGNOSIS CODES:     787.20 Dysphagia 530.81 GERD   CC:  PATIENT NAME:  Denise Summers, Denise G. MR#: [id number]

## 2014-07-14 NOTE — H&P (Signed)
Denise MassonSara G Summers is an 43 y.o. female.   Chief Complaint: Difficulty swallowing. HPI: 43 year old white female with a history of reflux and dysphagia here for an EGD. See office notes for details.   Past Medical History  Diagnosis Date  . Obesity   . GERD (gastroesophageal reflux disease)     silent reflux  . ZOXWRUEA(540.9Headache(784.0)    Past Surgical History  Procedure Laterality Date  . Cholecystectomy    . Tubal ligation    . Endometrial ablation     History reviewed. No pertinent family history. Social History:  reports that she has never smoked. She has never used smokeless tobacco. She reports that she drinks alcohol. She reports that she does not use illicit drugs.  Allergies: No Known Allergies  Medications Prior to Admission  Medication Sig Dispense Refill  . acetaminophen (TYLENOL) 500 MG tablet Take 1,000 mg by mouth every 6 (six) hours as needed for mild pain or moderate pain.       Marland Kitchen. zolpidem (AMBIEN) 10 MG tablet Take 10 mg by mouth at bedtime as needed for sleep.       No results found for this or any previous visit (from the past 48 hour(s)). No results found.  Review of Systems  Constitutional: Negative.   HENT: Negative.   Eyes: Negative.   Respiratory: Negative.   Cardiovascular: Negative.   Gastrointestinal: Positive for heartburn.  Genitourinary: Negative.   Musculoskeletal: Negative.   Neurological: Negative.   Endo/Heme/Allergies: Negative.   Psychiatric/Behavioral: The patient has insomnia.     Last menstrual period 02/01/2014. Physical Exam  Constitutional: She appears well-developed and well-nourished.  Eyes: Conjunctivae and EOM are normal. Pupils are equal, round, and reactive to light.  Neck: Normal range of motion. Neck supple.  Cardiovascular: Normal rate and regular rhythm.   Respiratory: Effort normal and breath sounds normal.  GI: Soft. Bowel sounds are normal.  Musculoskeletal: Normal range of motion.  Skin: Skin is warm and dry.   Psychiatric: She has a normal mood and affect. Her behavior is normal. Judgment and thought content normal.    Assessment/Plan GERD/Dysphagia: proceed with an EGD at this time.   Matthew Pais 07/14/2014, 7:52 AM

## 2014-07-14 NOTE — Discharge Instructions (Signed)
Esophagogastroduodenoscopy °Care After °Refer to this sheet in the next few weeks. These instructions provide you with information on caring for yourself after your procedure. Your caregiver may also give you more specific instructions. Your treatment has been planned according to current medical practices, but problems sometimes occur. Call your caregiver if you have any problems or questions after your procedure.  °HOME CARE INSTRUCTIONS °· Do not eat or drink anything until the numbing medicine (local anesthetic) has worn off and your gag reflex has returned. You will know that the local anesthetic has worn off when you can swallow comfortably. °· Do not drive for 12 hours after the procedure or as directed by your caregiver. °· Only take medicines as directed by your caregiver. °SEEK MEDICAL CARE IF:  °· You cannot stop coughing. °· You are not urinating at all or less than usual. °SEEK IMMEDIATE MEDICAL CARE IF: °· You have difficulty swallowing. °· You cannot eat or drink. °· You have worsening throat or chest pain. °· You have dizziness, lightheadedness, or you faint. °· You have nausea or vomiting. °· You have chills. °· You have a fever. °· You have severe abdominal pain. °· You have black, tarry, or bloody stools. °Document Released: 11/18/2012 Document Reviewed: 11/18/2012 °ExitCare® Patient Information ©2015 [employer]. This information is not intended to replace advice given to you by your health care provider. Make sure you discuss any questions you have with your health care provider. ° °

## 2014-07-18 ENCOUNTER — Encounter (HOSPITAL_COMMUNITY): Payer: Self-pay | Admitting: Gastroenterology

## 2015-01-08 ENCOUNTER — Emergency Department: Payer: Self-pay | Admitting: Emergency Medicine

## 2015-02-21 DIAGNOSIS — S8264XA Nondisplaced fracture of lateral malleolus of right fibula, initial encounter for closed fracture: Secondary | ICD-10-CM | POA: Insufficient documentation

## 2015-08-10 ENCOUNTER — Other Ambulatory Visit: Payer: Self-pay

## 2015-08-10 DIAGNOSIS — Z01818 Encounter for other preprocedural examination: Secondary | ICD-10-CM

## 2015-09-12 ENCOUNTER — Encounter: Payer: Self-pay | Admitting: Dietician

## 2015-09-12 ENCOUNTER — Encounter: Payer: Managed Care, Other (non HMO) | Attending: General Surgery | Admitting: Dietician

## 2015-09-12 DIAGNOSIS — Z6841 Body Mass Index (BMI) 40.0 and over, adult: Secondary | ICD-10-CM | POA: Insufficient documentation

## 2015-09-12 DIAGNOSIS — Z713 Dietary counseling and surveillance: Secondary | ICD-10-CM | POA: Insufficient documentation

## 2015-09-12 NOTE — Patient Instructions (Signed)

## 2015-09-12 NOTE — Progress Notes (Signed)
  Pre-Op Assessment Visit:  Pre-Operative Sleeve gastrectomy Surgery  Medical Nutrition Therapy:  Appt start time: 1010   End time:  1050  Patient was seen on 09/12/2015 for Pre-Operative Nutrition Assessment. Assessment and letter of approval faxed to [facility] Bariatric Surgery Program coordinator on 09/12/2015.   Preferred Learning Style:   No preference indicated   Learning Readiness:   Ready  Handouts given during visit include:  Pre-Op Goals Bariatric Surgery Protein Shakes   During the appointment today the following Pre-Op Goals were reviewed with the patient: Maintain or lose weight as instructed by your surgeon Make healthy food choices Begin to limit portion sizes Limited concentrated sugars and fried foods Keep fat/sugar in the single digits per serving on   food labels Practice CHEWING your food  (aim for 30 chews per bite or until applesauce consistency) Practice not drinking 15 minutes before, during, and 30 minutes after each meal/snack Avoid all carbonated beverages  Avoid/limit caffeinated beverages  Avoid all sugar-sweetened beverages Consume 3 meals per day; eat every 3-5 hours Make a list of non-food related activities Aim for 64-100 ounces of FLUID daily  Aim for at least 60-80 grams of PROTEIN daily Look for a liquid protein source that contain ?15 g protein and ?5 g carbohydrate  (ex: shakes, drinks, shots)  Patient-Centered Goals: -Self confidence and esteem -Being able to buy cute clothes  Scale of 1-10: confidence (10) /importance (10)  Demonstrated degree of understanding via:  Teach Back  Teaching Method Utilized:  Visual Auditory Hands on  Barriers to learning/adherence to lifestyle change: none  [facility] to call the [facility] to enroll in Pre-Op and Post-Op Nutrition Education when surgery date is scheduled.

## 2015-09-14 ENCOUNTER — Ambulatory Visit (INDEPENDENT_AMBULATORY_CARE_PROVIDER_SITE_OTHER): Payer: Managed Care, Other (non HMO) | Admitting: Psychology

## 2015-09-14 DIAGNOSIS — E669 Obesity, unspecified: Secondary | ICD-10-CM

## 2015-09-19 ENCOUNTER — Ambulatory Visit: Payer: Managed Care, Other (non HMO) | Admitting: Dietician

## 2015-09-28 ENCOUNTER — Ambulatory Visit: Payer: Managed Care, Other (non HMO) | Admitting: Psychology

## 2015-10-05 ENCOUNTER — Ambulatory Visit (INDEPENDENT_AMBULATORY_CARE_PROVIDER_SITE_OTHER): Payer: Managed Care, Other (non HMO) | Admitting: Psychology

## 2015-10-05 ENCOUNTER — Ambulatory Visit (HOSPITAL_COMMUNITY)
Admission: RE | Admit: 2015-10-05 | Discharge: 2015-10-05 | Disposition: A | Discharge status: Home / Self Care | Payer: Managed Care, Other (non HMO) | Source: Ambulatory Visit | Attending: General Surgery | Admitting: General Surgery

## 2015-10-05 ENCOUNTER — Other Ambulatory Visit: Payer: Self-pay

## 2015-10-05 ENCOUNTER — Inpatient Hospital Stay (HOSPITAL_COMMUNITY): Admission: RE | Admit: 2015-10-05 | Payer: Self-pay | Source: Ambulatory Visit

## 2015-10-05 DIAGNOSIS — Z01818 Encounter for other preprocedural examination: Secondary | ICD-10-CM | POA: Insufficient documentation

## 2015-10-05 DIAGNOSIS — K219 Gastro-esophageal reflux disease without esophagitis: Secondary | ICD-10-CM | POA: Insufficient documentation

## 2015-10-05 DIAGNOSIS — K449 Diaphragmatic hernia without obstruction or gangrene: Secondary | ICD-10-CM | POA: Insufficient documentation

## 2015-10-05 DIAGNOSIS — Z9049 Acquired absence of other specified parts of digestive tract: Secondary | ICD-10-CM | POA: Diagnosis not present

## 2015-10-05 DIAGNOSIS — E669 Obesity, unspecified: Secondary | ICD-10-CM

## 2015-10-09 ENCOUNTER — Encounter: Payer: Managed Care, Other (non HMO) | Attending: General Surgery

## 2015-10-09 DIAGNOSIS — Z713 Dietary counseling and surveillance: Secondary | ICD-10-CM | POA: Diagnosis not present

## 2015-10-09 DIAGNOSIS — Z6841 Body Mass Index (BMI) 40.0 and over, adult: Secondary | ICD-10-CM | POA: Diagnosis not present

## 2015-10-10 ENCOUNTER — Ambulatory Visit: Payer: Self-pay | Admitting: Dietician

## 2015-10-10 NOTE — Progress Notes (Signed)
  Pre-Operative Nutrition Class:  Appt start time: 5583   End time:  1830.  Patient was seen on 10/09/2015 for Pre-Operative Bariatric Surgery Education at the [facility].   Surgery date:  Surgery type: Sleeve gastrectomy Start weight at [facility]: 238 lbs on 09/12/15 Weight today: 237.5 lbs  TANITA  BODY COMP RESULTS  10/09/15   BMI (kg/m^2) 43.4   Fat Mass (lbs) 112.5   Fat Free Mass (lbs) 125   Total Body Water (lbs) 91.5   Samples given per MNT protocol. Patient educated on appropriate usage: Celebrate Multivitamin chew (berry - qty 1) Lot #: [id number] Exp: 02/2017  Celebrate Calcium Citrate chew (caramel - qty 1) Lot #: [id number] Exp:02/2017  PB2 (chocolate - qty 1) Lot #: N/A Exp: 10/2015  Renee Pain Protein Powder (chicken soup - qty 1) Lot #: [id number] Exp: 06/2016  The following the learning objectives were met by the patient during this course:  Identify Pre-Op Dietary Goals and will begin 2 weeks pre-operatively  Identify appropriate sources of fluids and proteins   State protein recommendations and appropriate sources pre and post-operatively  Identify Post-Operative Dietary Goals and will follow for 2 weeks post-operatively  Identify appropriate multivitamin and calcium sources  Describe the need for physical activity post-operatively and will follow MD recommendations  State when to call healthcare provider regarding medication questions or post-operative complications  Handouts given during class include:  Pre-Op Bariatric Surgery Diet Handout  Protein Shake Handout  Post-Op Bariatric Surgery Nutrition Handout  [facility] Program Information Flyer  Support Group Information Flyer  [facility] Bariatric Supplements Price List  Follow-Up Plan: Patient will follow-up at [facility] 2 weeks post operatively for diet advancement per MD.

## 2015-10-24 NOTE — Patient Instructions (Addendum)
YOUR PROCEDURE IS SCHEDULED ON :  10/30/15  REPORT TO [facility] MAIN ENTRANCE FOLLOW SIGNS TO EAST ELEVATOR - GO TO 3rd FLOOR CHECK IN AT 3 EAST NURSES STATION (SHORT STAY) AT:  11:00 AM  CALL THIS NUMBER IF YOU HAVE PROBLEMS THE MORNING OF SURGERY [phone]  REMEMBER:ONLY 1 PER PERSON MAY GO TO SHORT STAY WITH YOU TO GET READY THE MORNING OF YOUR SURGERY  DO NOT EAT FOOD OR DRINK LIQUIDS AFTER MIDNIGHT  TAKE THESE MEDICINES THE MORNING OF SURGERY: MAY TAKE XANAX IF NEEDED  MAY HAVE CLEAR LIQUIDS UNTIL 7:00 AM  CLEAR LIQUID DIET  Foods Allowed                                                                     Foods Excluded  Coffee and tea, regular and decaf                             liquids that you cannot  Plain Jell-O in any flavor                                             see through such as: Fruit ices (not with fruit pulp)                                     milk, soups, orange juice  Iced Popsicles                                                All solid food Carbonated beverages, regular and diet                                    Cranberry, grape and apple juices Sports drinks like Gatorade Lightly seasoned clear broth or consume(fat free) Sugar, honey syrup  _____________________________________________________________________    YOU MAY NOT HAVE ANY METAL ON YOUR BODY INCLUDING HAIR PINS AND PIERCING'S. DO NOT WEAR JEWELRY, MAKEUP, LOTIONS, POWDERS OR PERFUMES. DO NOT WEAR NAIL POLISH. DO NOT SHAVE 48 HRS PRIOR TO SURGERY. MEN MAY SHAVE FACE AND NECK.  DO NOT BRING VALUABLES TO HOSPITAL. [facility] IS NOT RESPONSIBLE FOR VALUABLES.  CONTACTS, DENTURES OR PARTIALS MAY NOT BE WORN TO SURGERY. LEAVE SUITCASE IN CAR. CAN BE BROUGHT TO ROOM AFTER SURGERY.  PATIENTS DISCHARGED THE DAY OF SURGERY WILL NOT BE ALLOWED TO DRIVE HOME.  PLEASE READ OVER THE FOLLOWING INSTRUCTION  SHEETS _________________________________________________________________________________                                          [facility] - PREPARING FOR SURGERY  Before surgery, you can play an important role.  Because skin  is not sterile, your skin needs to be as free of germs as possible.  You can reduce the number of germs on your skin by washing with CHG (chlorahexidine gluconate) soap before surgery.  CHG is an antiseptic cleaner which kills germs and bonds with the skin to continue killing germs even after washing. Please DO NOT use if you have an allergy to CHG or antibacterial soaps.  If your skin becomes reddened/irritated stop using the CHG and inform your nurse when you arrive at Short Stay. Do not shave (including legs and underarms) for at least 48 hours prior to the first CHG shower.  You may shave your face. Please follow these instructions carefully:   1.  Shower with CHG Soap the night before surgery and the  morning of Surgery.   2.  If you choose to wash your hair, wash your hair first as usual with your  normal  Shampoo.   3.  After you shampoo, rinse your hair and body thoroughly to remove the  shampoo.                                         4.  Use CHG as you would any other liquid soap.  You can apply chg directly  to the skin and wash . Gently wash with scrungie or clean wascloth    5.  Apply the CHG Soap to your body ONLY FROM THE NECK DOWN.   Do not use on open                           Wound or open sores. Avoid contact with eyes, ears mouth and genitals (private parts).                        Genitals (private parts) with your normal soap.              6.  Wash thoroughly, paying special attention to the area where your surgery  will be performed.   7.  Thoroughly rinse your body with warm water from the neck down.   8.  DO NOT shower/wash with your normal soap after using and rinsing off  the CHG Soap .                9.  Pat yourself dry with a clean  towel.             10.  Wear clean night clothes to bed after shower             11.  Place clean sheets on your bed the night of your first shower and do not  sleep with pets.  Day of Surgery : Do not apply any lotions/deodorants the morning of surgery.  Please wear clean clothes to the hospital/surgery center.  FAILURE TO FOLLOW THESE INSTRUCTIONS MAY RESULT IN THE CANCELLATION OF YOUR SURGERY    PATIENT SIGNATURE_________________________________  ______________________________________________________________________

## 2015-10-25 ENCOUNTER — Other Ambulatory Visit: Payer: Self-pay | Admitting: General Surgery

## 2015-10-25 NOTE — Progress Notes (Signed)
NEED ORDERS IN EPIC PLEASE - PT COMING FOR PREOP THURS 10/26/15 - THANK YOU

## 2015-10-26 ENCOUNTER — Encounter (HOSPITAL_COMMUNITY): Payer: Self-pay

## 2015-10-26 ENCOUNTER — Encounter (HOSPITAL_COMMUNITY)
Admission: RE | Admit: 2015-10-26 | Discharge: 2015-10-26 | Disposition: A | Discharge status: Home / Self Care | Payer: Managed Care, Other (non HMO) | Source: Ambulatory Visit | Attending: General Surgery | Admitting: General Surgery

## 2015-10-26 DIAGNOSIS — Z01818 Encounter for other preprocedural examination: Secondary | ICD-10-CM | POA: Insufficient documentation

## 2015-10-26 HISTORY — DX: Constipation, unspecified: K59.00

## 2015-10-26 LAB — CBC WITH DIFFERENTIAL/PLATELET
Basophils Absolute: 0 10*3/uL (ref 0.0–0.1)
Basophils Relative: 1 %
EOS ABS: 0.4 10*3/uL (ref 0.0–0.7)
EOS PCT: 8 %
HCT: 45 % (ref 36.0–46.0)
Hemoglobin: 14.4 g/dL (ref 12.0–15.0)
LYMPHS ABS: 1.4 10*3/uL (ref 0.7–4.0)
Lymphocytes Relative: 28 %
MCH: 31.4 pg (ref 26.0–34.0)
MCHC: 32 g/dL (ref 30.0–36.0)
MCV: 98.3 fL (ref 78.0–100.0)
MONOS PCT: 9 %
Monocytes Absolute: 0.5 10*3/uL (ref 0.1–1.0)
Neutro Abs: 2.7 10*3/uL (ref 1.7–7.7)
Neutrophils Relative %: 54 %
PLATELETS: 273 10*3/uL (ref 150–400)
RBC: 4.58 MIL/uL (ref 3.87–5.11)
RDW: 13.5 % (ref 11.5–15.5)
WBC: 4.9 10*3/uL (ref 4.0–10.5)

## 2015-10-26 LAB — COMPREHENSIVE METABOLIC PANEL
ALK PHOS: 63 U/L (ref 38–126)
ALT: 15 U/L (ref 14–54)
ANION GAP: 4 — AB (ref 5–15)
AST: 19 U/L (ref 15–41)
Albumin: 4 g/dL (ref 3.5–5.0)
BUN: 12 mg/dL (ref 6–20)
CALCIUM: 8.8 mg/dL — AB (ref 8.9–10.3)
CHLORIDE: 106 mmol/L (ref 101–111)
CO2: 28 mmol/L (ref 22–32)
Creatinine, Ser: 0.51 mg/dL (ref 0.44–1.00)
GFR calc non Af Amer: >60 mL/min (ref >60)
Glucose, Bld: 87 mg/dL (ref 65–99)
POTASSIUM: 3.9 mmol/L (ref 3.5–5.1)
SODIUM: 138 mmol/L (ref 135–145)
Total Bilirubin: 0.6 mg/dL (ref 0.3–1.2)
Total Protein: 7 g/dL (ref 6.5–8.1)

## 2015-10-27 ENCOUNTER — Other Ambulatory Visit: Payer: Self-pay | Admitting: General Surgery

## 2015-10-29 NOTE — H&P (Signed)
History of Present Illness Denise Summers. Gurveer Colucci MD; 10/25/2015 3:51 PM) Patient words: baritric.  The patient is a 44 year old female who presents with obesity. She returns for her preoperative visit prior to planned laparoscopic sleeve gastrectomy. She was referred by Dr Ardelle Park for consideration for surgical treatment for morbid obesity. The patient gives a history of progressive obesity since early adulthood despite multiple attempts at medical management. Obesity has been affecting the patient in a number of ways including significant lower extremity pain particularly ankle pain since an ankle fracture. She has to stand for about 12 hours at a time at work and this makes it very difficult for her. She has so far avoided any major comorbidities but is very concerned about her long-term health going forward at her current weight. She has successfully completed her preoperative workup prior to planned sleeve gastrectomy. No concerns on psychological nutrition evaluation. We reviewed her upper GI series that did show a small sliding hiatal hernia. She does not have reflux. We discussed that we would check for hiatal hernia at the time of surgery. She generally has been feeling okay with just chronic joint pain and no other significant illness    Problem List/Past Medical Mariella Saa, MD; 10/25/2015 3:51 PM) MORBID OBESITY WITH BMI OF 40.0-44.9, ADULT (E66.01)  Past Surgical History Mariella Saa, MD; 10/25/2015 3:51 PM) Gallbladder Surgery - Laparoscopic Gallbladder Surgery - Open  Diagnostic Studies History Mariella Saa, MD; 10/25/2015 3:51 PM) Pap Smear 1-5 years ago Colonoscopy never Mammogram 1-3 years ago  Allergies Gilmer Mor, CMA; 10/25/2015 2:55 PM) No Known Drug Allergies08/25/2016  Medication History Mariella Saa, MD; 10/25/2015 3:51 PM) Zolpidem Tartrate (  Tablet, Oral) Active. ALPRAZolam (0.5MG  Tablet, Oral) Active. Ibuprofen  (  Tablet, Oral) Active. Medications Reconciled Zofran ODT (  Tablet Disperse, 1 (one) Tablet Disperse Oral every six hours, as needed, Taken starting 10/25/2015) Active. Protonix (  Tablet DR, 1 (one) Tablet Oral daily, Taken starting 10/25/2015) Active. OxyCODONE HCl ( /5ML Solution, 5-10 Milliliter Oral every four hours, as needed, Taken starting 10/25/2015) Active.  Social History Mariella Saa, MD; 10/25/2015 3:51 PM) Tobacco use Never smoker. No drug use Alcohol use Occasional alcohol use. Caffeine use Carbonated beverages.  Family History Mariella Saa, MD; 10/25/2015 3:51 PM) Heart Disease Brother, Father. Hypertension Brother, Mother. Arthritis Mother. Diabetes Mellitus Sister.  Pregnancy / Birth History Mariella Saa, MD; 10/25/2015 3:51 PM) Age at menarche 13 years. Para 1 Maternal age 86-20 Gravida 1 Irregular periods  Vitals (Sonya Bynum CMA; 10/25/2015 2:55 PM) 10/25/2015 2:54 PM Weight: 234.6 lb Height: 62in Body Surface Area: 2.05 m Body Mass Index: 42.91 kg/m  Temp.: 97.28F(Temporal)  Pulse: 90 (Regular)  BP: 126/84 (Sitting, Left Arm, Standard)       Physical Exam Denise Summers. Breannah Kratt MD; 10/25/2015 3:52 PM) The physical exam findings are as follows: Note:General: Alert, morbidly obese Caucasian female, in no distress Skin: Warm and dry without rash or infection. HEENT: No palpable masses or thyromegaly. Sclera nonicteric. Lungs: Breath sounds clear and equal. No wheezing or increased work of breathing. Cardiovascular: Regular rate and rhythm without murmer. No JVD or edema. Abdomen: Nondistended. Soft and nontender. No masses palpable. No organomegaly. No palpable hernias. Extremities: No edema or joint swelling or deformity. No chronic venous stasis changes. Neurologic: Alert and fully oriented. Gait normal. No focal weakness. Psychiatric: Normal mood and affect. Thought content appropriate  with normal judgement and insight    Assessment & Plan Denise Summers. Kaedin Hicklin MD; 10/25/2015  3:54 PM) MORBID OBESITY WITH BMI OF 40.0-44.9, ADULT (E66.01) Impression: Patient with progressive morbid obesity unresponsive to multiple efforts at medical management who presents with a BMI of 44 and comorbidities of chronic joint pain. I believe there would be very significant medical benefit from surgical weight loss. Ready to proceed with planned laparoscopic sleeve gastrectomy. We reviewed the consent form. All her questions were answered. She is given prescriptions for pain medication as well as Protonix and Zofran.

## 2015-10-30 ENCOUNTER — Encounter (HOSPITAL_COMMUNITY)
Admission: RE | Disposition: A | Discharge status: Home / Self Care | Payer: Self-pay | Source: Ambulatory Visit | Attending: General Surgery

## 2015-10-30 ENCOUNTER — Inpatient Hospital Stay (HOSPITAL_COMMUNITY): Payer: Managed Care, Other (non HMO) | Admitting: Anesthesiology

## 2015-10-30 ENCOUNTER — Encounter (HOSPITAL_COMMUNITY): Payer: Self-pay | Admitting: *Deleted

## 2015-10-30 ENCOUNTER — Inpatient Hospital Stay (HOSPITAL_COMMUNITY)
Admission: RE | Admit: 2015-10-30 | Discharge: 2015-11-01 | DRG: 621 | Disposition: A | Discharge status: Home / Self Care | Payer: Managed Care, Other (non HMO) | Source: Ambulatory Visit | Attending: General Surgery | Admitting: General Surgery

## 2015-10-30 DIAGNOSIS — K219 Gastro-esophageal reflux disease without esophagitis: Secondary | ICD-10-CM | POA: Diagnosis present

## 2015-10-30 DIAGNOSIS — G8929 Other chronic pain: Secondary | ICD-10-CM | POA: Diagnosis present

## 2015-10-30 DIAGNOSIS — Z6841 Body Mass Index (BMI) 40.0 and over, adult: Secondary | ICD-10-CM

## 2015-10-30 DIAGNOSIS — M25579 Pain in unspecified ankle and joints of unspecified foot: Secondary | ICD-10-CM | POA: Diagnosis present

## 2015-10-30 DIAGNOSIS — K449 Diaphragmatic hernia without obstruction or gangrene: Secondary | ICD-10-CM | POA: Diagnosis present

## 2015-10-30 HISTORY — PX: UPPER GI ENDOSCOPY: SHX6162

## 2015-10-30 HISTORY — PX: LAPAROSCOPIC GASTRIC SLEEVE RESECTION WITH HIATAL HERNIA REPAIR: SHX6512

## 2015-10-30 LAB — PREGNANCY, URINE: PREG TEST UR: NEGATIVE

## 2015-10-30 LAB — HEMOGLOBIN AND HEMATOCRIT, BLOOD
HEMATOCRIT: 43 % (ref 36.0–46.0)
HEMOGLOBIN: 14.1 g/dL (ref 12.0–15.0)

## 2015-10-30 SURGERY — GASTRECTOMY, SLEEVE, LAPAROSCOPIC, WITH HIATAL HERNIA REPAIR
Anesthesia: General | Site: Esophagus

## 2015-10-30 MED ORDER — LACTATED RINGERS IV SOLN: INTRAVENOUS | Status: DC

## 2015-10-30 MED ORDER — PROMETHAZINE HCL 25 MG/ML IJ SOLN
INTRAMUSCULAR | Status: AC
  Filled 2015-10-30: qty 1

## 2015-10-30 MED ORDER — ROCURONIUM BROMIDE 100 MG/10ML IV SOLN
INTRAVENOUS | Status: DC | PRN
  Administered 2015-10-30 (×2): 10 mg via INTRAVENOUS
  Administered 2015-10-30: 50 mg via INTRAVENOUS

## 2015-10-30 MED ORDER — BUPIVACAINE-EPINEPHRINE (PF) 0.25% -1:200000 IJ SOLN
INTRAMUSCULAR | Status: AC
  Filled 2015-10-30: qty 30

## 2015-10-30 MED ORDER — LIDOCAINE HCL (CARDIAC) 20 MG/ML IV SOLN
INTRAVENOUS | Status: AC
  Filled 2015-10-30: qty 5

## 2015-10-30 MED ORDER — DEXAMETHASONE SODIUM PHOSPHATE 10 MG/ML IJ SOLN
INTRAMUSCULAR | Status: DC | PRN
  Administered 2015-10-30: 10 mg via INTRAVENOUS

## 2015-10-30 MED ORDER — LIDOCAINE HCL (CARDIAC) 20 MG/ML IV SOLN
INTRAVENOUS | Status: DC | PRN
  Administered 2015-10-30: 80 mg via INTRAVENOUS

## 2015-10-30 MED ORDER — FENTANYL CITRATE (PF) 250 MCG/5ML IJ SOLN
INTRAMUSCULAR | Status: AC
  Filled 2015-10-30: qty 25

## 2015-10-30 MED ORDER — DEXTROSE 5 % IV SOLN
INTRAVENOUS | Status: AC
  Filled 2015-10-30: qty 2

## 2015-10-30 MED ORDER — DEXTROSE 5 % IV SOLN
2.0000 g | INTRAVENOUS | Status: AC
  Administered 2015-10-30: 2 g via INTRAVENOUS

## 2015-10-30 MED ORDER — MIDAZOLAM HCL 2 MG/2ML IJ SOLN
INTRAMUSCULAR | Status: AC
  Filled 2015-10-30: qty 4

## 2015-10-30 MED ORDER — BUPIVACAINE LIPOSOME 1.3 % IJ SUSP
20.0000 mL | Freq: Once | INTRAMUSCULAR | Status: AC
  Administered 2015-10-30: 20 mL
  Filled 2015-10-30: qty 20

## 2015-10-30 MED ORDER — SODIUM CHLORIDE 0.9 % IJ SOLN
INTRAMUSCULAR | Status: AC
  Filled 2015-10-30: qty 10

## 2015-10-30 MED ORDER — MEPERIDINE HCL 50 MG/ML IJ SOLN: 6.2500 mg | INTRAMUSCULAR | Status: DC | PRN

## 2015-10-30 MED ORDER — MIDAZOLAM HCL 5 MG/5ML IJ SOLN
INTRAMUSCULAR | Status: DC | PRN
  Administered 2015-10-30: 2 mg via INTRAVENOUS

## 2015-10-30 MED ORDER — SODIUM CHLORIDE 0.9 % IJ SOLN
INTRAMUSCULAR | Status: DC | PRN
  Administered 2015-10-30: 10 mL

## 2015-10-30 MED ORDER — FENTANYL CITRATE (PF) 100 MCG/2ML IJ SOLN: 25.0000 ug | INTRAMUSCULAR | Status: DC | PRN

## 2015-10-30 MED ORDER — HEPARIN SODIUM (PORCINE) 5000 UNIT/ML IJ SOLN
5000.0000 [IU] | INTRAMUSCULAR | Status: AC
  Administered 2015-10-30: 5000 [IU] via SUBCUTANEOUS
  Filled 2015-10-30: qty 1

## 2015-10-30 MED ORDER — CETYLPYRIDINIUM CHLORIDE 0.05 % MT LIQD
7.0000 mL | Freq: Two times a day (BID) | OROMUCOSAL | Status: DC
  Administered 2015-10-30: 7 mL via OROMUCOSAL

## 2015-10-30 MED ORDER — ONDANSETRON HCL 4 MG/2ML IJ SOLN
4.0000 mg | INTRAMUSCULAR | Status: DC | PRN
  Administered 2015-10-30 (×2): 4 mg via INTRAVENOUS
  Filled 2015-10-30 (×2): qty 2

## 2015-10-30 MED ORDER — BUPIVACAINE-EPINEPHRINE 0.25% -1:200000 IJ SOLN
INTRAMUSCULAR | Status: DC | PRN
  Administered 2015-10-30: 25 mL

## 2015-10-30 MED ORDER — ACETAMINOPHEN 160 MG/5ML PO SOLN: 650.0000 mg | ORAL | Status: DC | PRN

## 2015-10-30 MED ORDER — GLYCOPYRROLATE 0.2 MG/ML IJ SOLN
INTRAMUSCULAR | Status: DC | PRN
  Administered 2015-10-30: .5 mg via INTRAVENOUS

## 2015-10-30 MED ORDER — MORPHINE SULFATE (PF) 2 MG/ML IV SOLN
2.0000 mg | INTRAVENOUS | Status: DC | PRN
  Administered 2015-10-30: 2 mg via INTRAVENOUS
  Administered 2015-10-30: 6 mg via INTRAVENOUS
  Administered 2015-10-31: 4 mg via INTRAVENOUS
  Filled 2015-10-30: qty 3
  Filled 2015-10-30: qty 1
  Filled 2015-10-30: qty 2

## 2015-10-30 MED ORDER — EPHEDRINE SULFATE 50 MG/ML IJ SOLN
INTRAMUSCULAR | Status: DC | PRN
  Administered 2015-10-30: 10 mg via INTRAVENOUS
  Administered 2015-10-30: 5 mg via INTRAVENOUS

## 2015-10-30 MED ORDER — PREMIER PROTEIN SHAKE
2.0000 [oz_av] | Freq: Four times a day (QID) | ORAL | Status: DC
  Administered 2015-11-01 (×2): 2 [oz_av] via ORAL

## 2015-10-30 MED ORDER — PROMETHAZINE HCL 25 MG/ML IJ SOLN
12.5000 mg | INTRAMUSCULAR | Status: DC | PRN
  Administered 2015-10-30: 12.5 mg via INTRAVENOUS
  Filled 2015-10-30: qty 1

## 2015-10-30 MED ORDER — LACTATED RINGERS IR SOLN
Status: DC | PRN
  Administered 2015-10-30: 1000 mL

## 2015-10-30 MED ORDER — PROPOFOL 10 MG/ML IV BOLUS
INTRAVENOUS | Status: DC | PRN
  Administered 2015-10-30: 200 mg via INTRAVENOUS

## 2015-10-30 MED ORDER — CHLORHEXIDINE GLUCONATE CLOTH 2 % EX PADS: 6.0000 | MEDICATED_PAD | Freq: Once | CUTANEOUS | Status: DC

## 2015-10-30 MED ORDER — 0.9 % SODIUM CHLORIDE (POUR BTL) OPTIME
TOPICAL | Status: DC | PRN
  Administered 2015-10-30: 1000 mL

## 2015-10-30 MED ORDER — POTASSIUM CHLORIDE IN NACL 20-0.9 MEQ/L-% IV SOLN
INTRAVENOUS | Status: DC
  Administered 2015-10-31 (×2): via INTRAVENOUS
  Administered 2015-10-31: 125 mL/h via INTRAVENOUS
  Administered 2015-11-01: 05:00:00 via INTRAVENOUS
  Filled 2015-10-30 (×8): qty 1000

## 2015-10-30 MED ORDER — PROPOFOL 10 MG/ML IV BOLUS
INTRAVENOUS | Status: AC
  Filled 2015-10-30: qty 20

## 2015-10-30 MED ORDER — ACETAMINOPHEN 160 MG/5ML PO SOLN: 325.0000 mg | ORAL | Status: DC | PRN

## 2015-10-30 MED ORDER — NEOSTIGMINE METHYLSULFATE 10 MG/10ML IV SOLN
INTRAVENOUS | Status: DC | PRN
  Administered 2015-10-30: 4 mg via INTRAVENOUS

## 2015-10-30 MED ORDER — LACTATED RINGERS IV SOLN
INTRAVENOUS | Status: DC
  Administered 2015-10-30: 14:00:00 via INTRAVENOUS
  Administered 2015-10-30: 1000 mL via INTRAVENOUS

## 2015-10-30 MED ORDER — CHLORHEXIDINE GLUCONATE 0.12 % MT SOLN
15.0000 mL | Freq: Two times a day (BID) | OROMUCOSAL | Status: DC
  Administered 2015-10-30 – 2015-11-01 (×4): 15 mL via OROMUCOSAL
  Filled 2015-10-30 (×5): qty 15

## 2015-10-30 MED ORDER — PHENYLEPHRINE HCL 10 MG/ML IJ SOLN
INTRAMUSCULAR | Status: DC | PRN
  Administered 2015-10-30: 80 ug via INTRAVENOUS

## 2015-10-30 MED ORDER — EVICEL 5 ML EX KIT
PACK | Freq: Once | CUTANEOUS | Status: AC
  Administered 2015-10-30: 1
  Filled 2015-10-30: qty 1

## 2015-10-30 MED ORDER — FENTANYL CITRATE (PF) 100 MCG/2ML IJ SOLN
INTRAMUSCULAR | Status: DC | PRN
  Administered 2015-10-30 (×2): 50 ug via INTRAVENOUS
  Administered 2015-10-30: 100 ug via INTRAVENOUS

## 2015-10-30 MED ORDER — PROMETHAZINE HCL 25 MG/ML IJ SOLN
6.2500 mg | INTRAMUSCULAR | Status: AC | PRN
Start: 2015-10-30 — End: 2015-10-30
  Administered 2015-10-30 (×2): 6.25 mg via INTRAVENOUS

## 2015-10-30 MED ORDER — ENOXAPARIN SODIUM 30 MG/0.3ML ~~LOC~~ SOLN
30.0000 mg | Freq: Two times a day (BID) | SUBCUTANEOUS | Status: DC
  Administered 2015-10-30 – 2015-11-01 (×4): 30 mg via SUBCUTANEOUS
  Filled 2015-10-30 (×5): qty 0.3

## 2015-10-30 MED ORDER — ONDANSETRON HCL 4 MG/2ML IJ SOLN
INTRAMUSCULAR | Status: DC | PRN
  Administered 2015-10-30 (×2): 4 mg via INTRAVENOUS

## 2015-10-30 MED ORDER — ONDANSETRON HCL 4 MG/2ML IJ SOLN
INTRAMUSCULAR | Status: AC
  Filled 2015-10-30: qty 2

## 2015-10-30 MED ORDER — PANTOPRAZOLE SODIUM 40 MG IV SOLR
40.0000 mg | Freq: Every day | INTRAVENOUS | Status: DC
  Administered 2015-10-30 – 2015-10-31 (×2): 40 mg via INTRAVENOUS
  Filled 2015-10-30 (×3): qty 40

## 2015-10-30 MED ORDER — SUGAMMADEX SODIUM 200 MG/2ML IV SOLN
INTRAVENOUS | Status: AC
  Filled 2015-10-30: qty 2

## 2015-10-30 MED ORDER — OXYCODONE HCL 5 MG/5ML PO SOLN
5.0000 mg | ORAL | Status: DC | PRN
  Administered 2015-10-31 – 2015-11-01 (×2): 5 mg via ORAL
  Filled 2015-10-30 (×2): qty 5

## 2015-10-30 MED ORDER — ROCURONIUM BROMIDE 100 MG/10ML IV SOLN
INTRAVENOUS | Status: AC
  Filled 2015-10-30: qty 1

## 2015-10-30 SURGICAL SUPPLY — 60 items
APPLICATOR COTTON TIP 6IN STRL (MISCELLANEOUS) IMPLANT
APPLIER CLIP ROT 10 11.4 M/L (STAPLE)
APPLIER CLIP ROT 13.4 12 LRG (CLIP)
BLADE SURG SZ11 CARB STEEL (BLADE) ×4 IMPLANT
CABLE HIGH FREQUENCY MONO STRZ (ELECTRODE) ×4 IMPLANT
CHLORAPREP W/TINT 26ML (MISCELLANEOUS) ×4 IMPLANT
CLIP APPLIE ROT 10 11.4 M/L (STAPLE) IMPLANT
CLIP APPLIE ROT 13.4 12 LRG (CLIP) IMPLANT
COVER SURGICAL LIGHT HANDLE (MISCELLANEOUS) IMPLANT
DEVICE SUT QUICK LOAD TK 5 (STAPLE) IMPLANT
DEVICE SUT TI-KNOT TK 5X26 (MISCELLANEOUS) IMPLANT
DEVICE SUTURE ENDOST 10MM (ENDOMECHANICALS) IMPLANT
DEVICE TI KNOT TK5 (MISCELLANEOUS)
DEVICE TROCAR PUNCTURE CLOSURE (ENDOMECHANICALS) ×4 IMPLANT
DRAPE CAMERA CLOSED 9X96 (DRAPES) ×4 IMPLANT
DRAPE UTILITY XL STRL (DRAPES) ×8 IMPLANT
ELECT REM PT RETURN 9FT ADLT (ELECTROSURGICAL) ×4
ELECTRODE REM PT RTRN 9FT ADLT (ELECTROSURGICAL) ×2 IMPLANT
EVICEL AIRLESS SPRAY ACCES (MISCELLANEOUS) ×4 IMPLANT
GAUZE SPONGE 4X4 12PLY STRL (GAUZE/BANDAGES/DRESSINGS) IMPLANT
GLOVE BIOGEL PI IND STRL 7.5 (GLOVE) ×2 IMPLANT
GLOVE BIOGEL PI INDICATOR 7.5 (GLOVE) ×2
GLOVE ECLIPSE 7.5 STRL STRAW (GLOVE) ×4 IMPLANT
GOWN STRL REUS W/TWL XL LVL3 (GOWN DISPOSABLE) ×16 IMPLANT
HOVERMATT SINGLE USE (MISCELLANEOUS) ×4 IMPLANT
KIT BASIN OR (CUSTOM PROCEDURE TRAY) ×4 IMPLANT
LIQUID BAND (GAUZE/BANDAGES/DRESSINGS) ×4 IMPLANT
NEEDLE SPNL 22GX3.5 QUINCKE BK (NEEDLE) ×4 IMPLANT
PACK UNIVERSAL I (CUSTOM PROCEDURE TRAY) ×4 IMPLANT
PEN SKIN MARKING BROAD (MISCELLANEOUS) ×4 IMPLANT
POUCH SPECIMEN RETRIEVAL 10MM (ENDOMECHANICALS) IMPLANT
QUICK LOAD TK 5 (STAPLE)
RELOAD STAPLER BLUE 60MM (STAPLE) ×4 IMPLANT
RELOAD STAPLER GOLD 60MM (STAPLE) ×4 IMPLANT
RELOAD STAPLER GREEN 60MM (STAPLE) ×2 IMPLANT
SCISSORS LAP 5X45 EPIX DISP (ENDOMECHANICALS) ×4 IMPLANT
SET IRRIG TUBING LAPAROSCOPIC (IRRIGATION / IRRIGATOR) ×4 IMPLANT
SHEARS CURVED HARMONIC AC 45CM (MISCELLANEOUS) ×4 IMPLANT
SLEEVE ADV FIXATION 5X100MM (TROCAR) ×4 IMPLANT
SLEEVE GASTRECTOMY 36FR VISIGI (MISCELLANEOUS) ×4 IMPLANT
SOLUTION ANTI FOG 6CC (MISCELLANEOUS) ×4 IMPLANT
SPONGE LAP 18X18 X RAY DECT (DISPOSABLE) ×4 IMPLANT
STAPLER ECHELON LONG 60 440 (INSTRUMENTS) ×4 IMPLANT
STAPLER RELOAD BLUE 60MM (STAPLE) ×8
STAPLER RELOAD GOLD 60MM (STAPLE) ×8
STAPLER RELOAD GREEN 60MM (STAPLE) ×4
SUT DEVICE BRAIDED 0X39 (SUTURE) IMPLANT
SUT MNCRL AB 4-0 PS2 18 (SUTURE) ×4 IMPLANT
SUT VICRYL 0 TIES 12 18 (SUTURE) ×4 IMPLANT
SYR 10ML ECCENTRIC (SYRINGE) ×4 IMPLANT
SYR 20CC LL (SYRINGE) ×4 IMPLANT
TOWEL OR 17X26 10 PK STRL BLUE (TOWEL DISPOSABLE) ×4 IMPLANT
TOWEL OR NON WOVEN STRL DISP B (DISPOSABLE) ×4 IMPLANT
TROCAR ADV FIXATION 5X100MM (TROCAR) ×4 IMPLANT
TROCAR BLADELESS 15MM (ENDOMECHANICALS) ×4 IMPLANT
TROCAR BLADELESS OPT 5 100 (ENDOMECHANICALS) ×4 IMPLANT
TUBING CONNECTING 10 (TUBING) ×3 IMPLANT
TUBING CONNECTING 10' (TUBING) ×1
TUBING ENDO SMARTCAP PENTAX (MISCELLANEOUS) ×4 IMPLANT
TUBING FILTER THERMOFLATOR (ELECTROSURGICAL) ×4 IMPLANT

## 2015-10-30 NOTE — Anesthesia Preprocedure Evaluation (Addendum)
Anesthesia Evaluation  Patient identified by MRN, date of birth, ID band Patient awake    Reviewed: Allergy & Precautions, NPO status , Patient's Chart, lab work & pertinent test results  Airway Mallampati: II  TM Distance: >3 FB Neck ROM: Full    Dental no notable dental hx.    Pulmonary neg pulmonary ROS,    Pulmonary exam normal breath sounds clear to auscultation       Cardiovascular negative cardio ROS Normal cardiovascular exam Rhythm:Regular Rate:Normal     Neuro/Psych negative neurological ROS  negative psych ROS   GI/Hepatic Neg liver ROS, GERD  ,  Endo/Other  Morbid obesity  Renal/GU negative Renal ROS  negative genitourinary   Musculoskeletal negative musculoskeletal ROS (+)   Abdominal   Peds negative pediatric ROS (+)  Hematology negative hematology ROS (+)   Anesthesia Other Findings   Reproductive/Obstetrics negative OB ROS                             Anesthesia Physical Anesthesia Plan  ASA: II  Anesthesia Plan: General   Post-op Pain Management:    Induction: Intravenous  Airway Management Planned: Oral ETT  Additional Equipment:   Intra-op Plan:   Post-operative Plan: Extubation in OR  Informed Consent: I have reviewed the patients History and Physical, chart, labs and discussed the procedure including the risks, benefits and alternatives for the proposed anesthesia with the patient or authorized representative who has indicated his/her understanding and acceptance.   Dental advisory given  Plan Discussed with: CRNA  Anesthesia Plan Comments:         Anesthesia Quick Evaluation

## 2015-10-30 NOTE — Interval H&P Note (Signed)
History and Physical Interval Note:  10/30/2015 12:18 PM  Denise MassonSara G Polsky  has presented today for surgery, with the diagnosis of Morbid Obesity  The various methods of treatment have been discussed with the patient and family. After consideration of risks, benefits and other options for treatment, the patient has consented to  Procedure(s): LAPAROSCOPIC GASTRIC SLEEVE RESECTION WITH HIATAL HERNIA REPAIR (N/A) as a surgical intervention .  The patient's history has been reviewed, patient examined, no change in status, stable for surgery.  I have reviewed the patient's chart and labs.  Questions were answered to the patient's satisfaction.     [name]

## 2015-10-30 NOTE — Anesthesia Postprocedure Evaluation (Signed)
  Anesthesia Post-op Note  Patient: Denise MassonSara G Summers  Procedure(s) Performed: Procedure(s) (LRB): LAPAROSCOPIC GASTRIC SLEEVE RESECTION WITH REPAIR OF HIATAL HERNIA REPAIR (N/A) UPPER GI ENDOSCOPY (N/A)  Patient Location: PACU  Anesthesia Type: General  Level of Consciousness: awake and alert   Airway and Oxygen Therapy: Patient Spontanous Breathing  Post-op Pain: mild  Post-op Assessment: Post-op Vital signs reviewed, Patient's Cardiovascular Status Stable, Respiratory Function Stable, Patent Airway and No signs of Nausea or vomiting  Last Vitals:  Filed Vitals:   10/30/15 1711  BP: 144/72  Pulse: 63  Temp:   Resp: 20    Post-op Vital Signs: stable   Complications: No apparent anesthesia complications

## 2015-10-30 NOTE — Anesthesia Procedure Notes (Signed)
Procedure Name: Intubation Date/Time: 10/30/2015 12:41 PM Performed by: Thornell MuleSTUBBLEFIELD, Janell Keeling G Pre-anesthesia Checklist: Patient identified, Emergency Drugs available, Suction available and Patient being monitored Patient Re-evaluated:Patient Re-evaluated prior to inductionOxygen Delivery Method: Circle System Utilized Preoxygenation: Pre-oxygenation with 100% oxygen Intubation Type: IV induction Ventilation: Mask ventilation without difficulty Laryngoscope Size: Miller and 3 Grade View: Grade I Tube type: Oral Tube size: 7.0 mm Number of attempts: 1 Airway Equipment and Method: Stylet and Oral airway Placement Confirmation: ETT inserted through vocal cords under direct vision,  positive ETCO2 and breath sounds checked- equal and bilateral Secured at: 20 cm Tube secured with: Tape Dental Injury: Teeth and Oropharynx as per pre-operative assessment

## 2015-10-30 NOTE — Op Note (Signed)
Preoperative Diagnosis: Morbid Obesity  Postoprative Diagnosis: Morbid Obesity  Procedure: Procedure(s): LAPAROSCOPIC GASTRIC SLEEVE RESECTION WITH REPAIR OF HIATAL HERNIA UPPER GI ENDOSCOPY   Surgeon: Glenna Fellows T   Assistants: Feliciana Rossetti  Anesthesia:  General endotracheal anesthesia  Indications: Patient is a 44 year old female with progressive morbid obesity unresponsive to medical management who presents at a BMI of 44 with comorbidities of chronic joint pain. After extensive preoperative discussion and workup detailed elsewhere we have elected to proceed with laparoscopic sleeve gastrectomy as surgical treatment for her morbid obesity.  Procedure Detail: Patient was brought to the operating room, placed in the supine position on the operating table, and general endotracheal anesthesia induced. She received preoperative IV antibiotics and subcutaneous heparin. PAS were placed. The abdomen was widely sterilely prepped and draped. Patient timeout was performed and correct procedure verified. Access was obtained with a 5 mm Optiview trocar in the left upper quadrant without difficulty and pneumoperitoneum established. There was no evidence of trocar injury. Under direct vision a 5 mm trocar was placed laterally in the right upper quadrant, a 15 mm trocar medially in the right upper abdomen at the base of the falciform ligament, a 5 mm trocar above and to the left of the umbilicus for the camera port and a 5 mm trocar in the left upper quadrant. 35 mm subxiphoid site the Nathanson retractor was placed in the left lobe of the liver elevated with excellent exposure of the entire stomach and hiatus. The patient had a small hiatal hernia on her preoperative upper GI series. There did appear to be somewhat of a dimple. The sizing tube was passed orally into the stomach and the 10 mL balloon was able to be drawn back up through the hiatus without difficulty. The balloon was deflated and the  sizing tube backed into the esophagus. We proceeded with hiatal hernia repair. The gastrohepatic ligament was divided along an avascular area and dissection carried with Harmonic scalpel up over the anterior esophagus at the hiatus. The right crus was dissected along its anterior border and careful blunt dissection was carried back into the retroesophageal space. The left crus was fully dissected. There was a moderate sized obvious hiatal hernia. There was plenty of esophageal length. A posterior crural repair was done with 2 #1 Ethibond sutures using the endo 360 with large bites of the crura.  This closed the hiatus down to a normal size without 2-1/2 cm. The sizing tube was passed back down into the stomach without difficulty. With the 10 mL balloon inflated it pulled back snugly against the hiatus. The balloon was desufflated and this tube removed. Attention was turned to the sleeve gastrectomy. The pylorus was carefully identified and we carefully measured 5 cm along the greater curve. The dissection of the greater curve was begun here with the Harmonic scalpel and the lesser sac quickly entered without difficulty. Dissection progressed proximally dividing the lesser omentum from the greater curve of the stomach with harmonic scalpel. Dissection progressed proximally and individual short gastric vessels were ligated with harmonic scalpel. The fundus was dissected away from the spleen. The left crus was completely dissected and our previous crural repair exposed from the left side and all short gastric vessels completely taken down. This point the stomach was completely mobilized along its lesser curve vasculature. The VisiG gastric tube was passed orally with its tip down to the pylorus and was positioned along the lesser curve with the stomach splayed out symmetrically. The tube was placed on  suction. The sleeve was begun with an initial firing of the green load echelon 60 mm stapler beginning at 5 cm from the  pylorus and working up toward but staying away from the incisura. The stomach appeared relatively thin. A second firing of the gold load 60 mm stapler carrying the staple line and passed the incisura staying a little bit away from the tube at this point. The sleeve was then completed with 3 further firings of the blue load echelon 60 mm stapler staying close to but not tight onto the VisiG tube and the final staple line carried just out lateral to the esophageal fat pad. The VisiG tube was inflated and under saline irrigation there was no evidence of leak. The sleeve was symmetrical with no kinking or twisting or narrowing. The VisiG tube was removed. Dr. Sheliah HatchKinsinger performed upper endoscopy and again tensely distending the sleeve that was no evidence of leak. No bleeding or stricture. The sleeve was desufflated. A couple of minor bleeding points along the staple line were controlled with clips. The staple line was coated with Evaseal tissue sealant. Following this the specimen was removed through the 15 mm trocar site after dilating it slightly. The fascia at this site was closed with a 0 Vicryl suture placed near the Endo Close. Trocar sites were infiltrated with Exparel local anesthesia. There was no evidence of bleeding or trocar injury or other problems. All CO2 was evacuated and trochars removed. The Nathanson retractor had been removed under direct vision. Skin incisions were closed with subcuticular 5-0 Monocryl and Liquiban.    Findings: As above  Estimated Blood Loss:  Minimal         Drains: none  Blood Given: none          Specimens: Sleeve gastrectomy        Complications:  * No complications entered in OR log *         Disposition: PACU - hemodynamically stable.         Condition: stable

## 2015-10-30 NOTE — Transfer of Care (Signed)
Immediate Anesthesia Transfer of Care Note  Patient: Denise MassonSara G Bluett  Procedure(s) Performed: Procedure(s): LAPAROSCOPIC GASTRIC SLEEVE RESECTION WITH REPAIR OF HIATAL HERNIA REPAIR (N/A) UPPER GI ENDOSCOPY (N/A)  Patient Location: PACU  Anesthesia Type:General  Level of Consciousness: awake, alert  and oriented  Airway & Oxygen Therapy: Patient Spontanous Breathing and Patient connected to face mask oxygen  Post-op Assessment: Report given to RN and Post -op Vital signs reviewed and stable  Post vital signs: Reviewed and stable  Last Vitals:  Filed Vitals:   10/30/15 1052  BP: 124/77  Pulse: 66  Temp: 36.3 C  Resp: 18    Complications: No apparent anesthesia complications

## 2015-10-30 NOTE — Op Note (Signed)
Preoperative diagnosis: laparoscopic sleeve gastrectomy  Postoperative diagnosis: Same   Procedure: Upper endoscopy   Surgeon: Feliciana RossettiLuke Kinsinger, M.D.  Anesthesia: Gen.   Indications for procedure: This patient was undergoing a laparoscopic sleeve gastrectomy.   Description of procedure: The endoscopy was placed in the mouth and into the oropharynx and under endoscopic vision it was advanced to the esophagogastric junction. The pouch was insufflated and no bleeding or bubbles were seen. The GEJ was identified at 35cm from the teeth. Small amount of bile seen in esophagus. No bleeding or leaks were detected. The scope was withdrawn without difficulty.   Feliciana RossettiLuke Kinsinger, M.D. General, Bariatric, & Minimally Invasive Surgery [facility]

## 2015-10-31 LAB — CBC WITH DIFFERENTIAL/PLATELET
Basophils Absolute: 0 10*3/uL (ref 0.0–0.1)
Basophils Relative: 0 %
EOS ABS: 0 10*3/uL (ref 0.0–0.7)
Eosinophils Relative: 0 %
HEMATOCRIT: 38.2 % (ref 36.0–46.0)
HEMOGLOBIN: 12.5 g/dL (ref 12.0–15.0)
LYMPHS ABS: 0.7 10*3/uL (ref 0.7–4.0)
Lymphocytes Relative: 9 %
MCH: 31.2 pg (ref 26.0–34.0)
MCHC: 32.7 g/dL (ref 30.0–36.0)
MCV: 95.3 fL (ref 78.0–100.0)
MONOS PCT: 8 %
Monocytes Absolute: 0.6 10*3/uL (ref 0.1–1.0)
NEUTROS ABS: 6.3 10*3/uL (ref 1.7–7.7)
NEUTROS PCT: 83 %
Platelets: 253 10*3/uL (ref 150–400)
RBC: 4.01 MIL/uL (ref 3.87–5.11)
RDW: 13.1 % (ref 11.5–15.5)
WBC: 7.6 10*3/uL (ref 4.0–10.5)

## 2015-10-31 LAB — HEMOGLOBIN AND HEMATOCRIT, BLOOD
HCT: 38.6 % (ref 36.0–46.0)
HEMOGLOBIN: 12.8 g/dL (ref 12.0–15.0)

## 2015-10-31 NOTE — Progress Notes (Signed)
Patient alert and oriented, Post op day 1.  Provided support and encouragement.  Encouraged pulmonary toilet, ambulation and small sips of liquids.  All questions answered.  Will continue to monitor. 

## 2015-10-31 NOTE — Plan of Care (Signed)
Problem: Food- and Nutrition-Related Knowledge Deficit (NB-1.1) Goal: Nutrition education Formal process to instruct or train a patient/client in a skill or to impart knowledge to help patients/clients voluntarily manage or modify food choices and eating behavior to maintain or improve health. Outcome: Completed/Met Date Met:  10/31/15 Nutrition Education Note  Received consult for diet education per DROP protocol.   Discussed 2 week post op diet with pt. Emphasized that liquids must be non carbonated, non caffeinated, and sugar free. Fluid goals discussed. Pt to follow up with outpatient bariatric RD for further diet progression after 2 weeks. Multivitamins and minerals also reviewed. Teach back method used, pt expressed understanding, expect good compliance.   Diet: First 2 Weeks  You will see the nutritionist about two (2) weeks after your surgery. The nutritionist will increase the types of foods you can eat if you are handling liquids well:  If you have severe vomiting or nausea and cannot handle clear liquids lasting longer than 1 day, call your surgeon  Protein Shake  Drink at least 2 ounces of shake 5-6 times per day  Each serving of protein shakes (usually 8 - 12 ounces) should have a minimum of:  15 grams of protein  And no more than 5 grams of carbohydrate  Goal for protein each day:  Men = 80 grams per day  Women = 60 grams per day  Protein powder may be added to fluids such as non-fat milk or Lactaid milk or Soy milk (limit to 35 grams added protein powder per serving)   Hydration  Slowly increase the amount of water and other clear liquids as tolerated (See Acceptable Fluids)  Slowly increase the amount of protein shake as tolerated  Sip fluids slowly and throughout the day  May use sugar substitutes in small amounts (no more than 6 - 8 packets per day; i.e. Splenda)   Fluid Goal  The first goal is to drink at least 8 ounces of protein shake/drink per day (or as directed  by the nutritionist); some examples of protein shakes are Syntrax Nectar, Adkins Advantage, EAS Edge HP, and Unjury. See handout from pre-op Bariatric Education Class:  Slowly increase the amount of protein shake you drink as tolerated  You may find it easier to slowly sip shakes throughout the day  It is important to get your proteins in first  Your fluid goal is to drink 64 - 100 ounces of fluid daily  It may take a few weeks to build up to this  32 oz (or more) should be clear liquids  And  32 oz (or more) should be full liquids (see below for examples)  Liquids should not contain sugar, caffeine, or carbonation   Clear Liquids:  Water or Sugar-free flavored water (i.e. Fruit H2O, Propel)  Decaffeinated coffee or tea (sugar-free)  Crystal Lite, Wyler's Lite, Minute Maid Lite  Sugar-free Jell-O  Bouillon or broth  Sugar-free Popsicle: *Less than 20 calories each; Limit 1 per day   Full Liquids:  Protein Shakes/Drinks + 2 choices per day of other full liquids  Full liquids must be:  No More Than 12 grams of Carbs per serving  No More Than 3 grams of Fat per serving  Strained low-fat cream soup  Non-Fat milk  Fat-free Lactaid Milk  Sugar-free yogurt (Dannon Lite & Fit, Greek yogurt)     Xayvion Shirah, MS, RD, LDN Pager: [phone] After Hours Pager: [phone]        

## 2015-10-31 NOTE — Care Management Note (Signed)
Case Management Note  Patient Details  Name: Vicente MassonSara G Dimercurio MRN: [id number] Date of Birth: 12/02/1971  Subjective/Objective:    S/p laparoscopic sleeve gastrectomy                Action/Plan: Discharge planning,no needs  Expected Discharge Date:                  Expected Discharge Plan:  Home/Self Care  In-House Referral:  NA  Discharge planning Services  CM Consult  Post Acute Care Choice:  NA Choice offered to:  NA  DME Arranged:  N/A DME Agency:  NA  HH Arranged:  NA HH Agency:  NA  Status of Service:  Completed, signed off  Medicare Important Message Given:    Date Medicare IM Given:    Medicare IM give by:    Date Additional Medicare IM Given:    Additional Medicare Important Message give by:     If discussed at Long Length of Stay Meetings, dates discussed:    Additional Comments:  Alexis Goodelleele, Shruti Arrey K, RN 10/31/2015, 12:34 PM

## 2015-10-31 NOTE — Progress Notes (Signed)
Patient ID: Denise MassonSara G Summers, female   DOB: 05/22/1971, 44 y.o.   MRN: [id number] 1 Day Post-Op  Subjective: Was very nauseated last night but this has now resolved. No complaints. Denies pain. Up walking in the halls.  Objective: Vital signs in last 24 hours: Temp:  [97.4 F (36.3 C)-99 F (37.2 C)] 99 F (37.2 C) (11/15 0615) Pulse Rate:  [54-87] 55 (11/15 0615) Resp:  [16-26] 16 (11/15 0615) BP: (118-146)/(55-83) 118/55 mmHg (11/15 0615) SpO2:  [97 %-100 %] 97 % (11/15 0615) Weight:  [104.951 kg (231 lb 6 oz)] 104.951 kg (231 lb 6 oz) (11/14 1052) Last BM Date: 10/30/15  Intake/Output from previous day: 11/14 0701 - 11/15 0700 In: 1250 [I.V.:1250] Out: 925 [Urine:900; Blood:25] Intake/Output this shift: Total I/O In: -  Out: 550 [Urine:550]  General appearance: alert, cooperative and no distress Resp: clear to auscultation bilaterally GI: normal findings: soft, non-tender Incision/Wound: no erythema or drainage  Lab Results:   Recent Labs  10/30/15 2005 10/31/15 0550  WBC  --  7.6  HGB 14.1 12.5  HCT 43.0 38.2  PLT  --  253   BMET No results for input(s): NA, K, CL, CO2, GLUCOSE, BUN, CREATININE, CALCIUM in the last 72 hours.   Studies/Results: No results found.  Anti-infectives: Anti-infectives    Start     Dose/Rate Route Frequency Ordered Stop   10/30/15 1059  cefOXitin (MEFOXIN) 2 g in dextrose 5 % 50 mL IVPB     2 g 100 mL/hr over 30 Minutes Intravenous On call to O.R. 10/30/15 1059 10/30/15 1247      Assessment/Plan: s/p Procedure(s): LAPAROSCOPIC GASTRIC SLEEVE RESECTION WITH REPAIR OF HIATAL HERNIA REPAIR UPPER GI ENDOSCOPY Doing well postoperatively without apparent complication. Start postop day 1 diet   LOS: 1 day    Chrystie Hagwood T 10/31/2015

## 2015-11-01 LAB — CBC WITH DIFFERENTIAL/PLATELET
BASOS ABS: 0 10*3/uL (ref 0.0–0.1)
BASOS PCT: 0 %
Eosinophils Absolute: 0 10*3/uL (ref 0.0–0.7)
Eosinophils Relative: 0 %
HEMATOCRIT: 36.4 % (ref 36.0–46.0)
HEMOGLOBIN: 11.6 g/dL — AB (ref 12.0–15.0)
Lymphocytes Relative: 24 %
Lymphs Abs: 1.7 10*3/uL (ref 0.7–4.0)
MCH: 30.9 pg (ref 26.0–34.0)
MCHC: 31.9 g/dL (ref 30.0–36.0)
MCV: 96.8 fL (ref 78.0–100.0)
MONO ABS: 0.5 10*3/uL (ref 0.1–1.0)
Monocytes Relative: 8 %
NEUTROS ABS: 4.6 10*3/uL (ref 1.7–7.7)
NEUTROS PCT: 68 %
Platelets: 217 10*3/uL (ref 150–400)
RBC: 3.76 MIL/uL — ABNORMAL LOW (ref 3.87–5.11)
RDW: 13.5 % (ref 11.5–15.5)
WBC: 6.9 10*3/uL (ref 4.0–10.5)

## 2015-11-01 NOTE — Progress Notes (Signed)
Patient ID: Denise MassonSara G Summers, female   DOB: 01/17/1971, 44 y.o.   MRN: [id number] 2 Days Post-Op  Subjective: No complaints.Mild soreness. Tolerating water well.  Objective: Vital signs in last 24 hours: Temp:  [98 F (36.7 C)-99.7 F (37.6 C)] 98 F (36.7 C) (11/16 0613) Pulse Rate:  [53-56] 53 (11/16 0613) Resp:  [16-18] 16 (11/16 0613) BP: (101-159)/(58-90) 132/58 mmHg (11/16 0613) SpO2:  [96 %-100 %] 100 % (11/16 [id number]) Last BM Date: 10/30/15  Intake/Output from previous day: 11/15 0701 - 11/16 0700 In: 3180 [P.O.:180; I.V.:3000] Out: 1900 [Urine:1900] Intake/Output this shift:    General appearance: alert, cooperative and no distress GI: soft and nontender Incision/Wound: clean and dry  Lab Results:   Recent Labs  10/31/15 0550 10/31/15 2043 11/01/15 0450  WBC 7.6  --  6.9  HGB 12.5 12.8 11.6*  HCT 38.2 38.6 36.4  PLT 253  --  217   BMET No results for input(s): NA, K, CL, CO2, GLUCOSE, BUN, CREATININE, CALCIUM in the last 72 hours.   Studies/Results: No results found.  Anti-infectives: Anti-infectives    Start     Dose/Rate Route Frequency Ordered Stop   10/30/15 1059  cefOXitin (MEFOXIN) 2 g in dextrose 5 % 50 mL IVPB     2 g 100 mL/hr over 30 Minutes Intravenous On call to O.R. 10/30/15 1059 10/30/15 1247      Assessment/Plan: s/p Procedure(s): LAPAROSCOPIC GASTRIC SLEEVE RESECTION WITH REPAIR OF HIATAL HERNIA REPAIR UPPER GI ENDOSCOPY Doing well without apparent complication.Okay for discharge.   LOS: 2 days    Mariona Scholes T 11/01/2015

## 2015-11-01 NOTE — Discharge Summary (Signed)
   Patient ID: Denise MassonSara G Sloss [id number] 44 y.o. 10/20/1971  10/30/2015  Discharge date and time: 11/01/2015   Admitting Physician: Glenna FellowsHOXWORTH,Berneda Piccininni T  Discharge Physician: Glenna FellowsHOXWORTH,Ayjah Show T  Admission Diagnoses: Morbid Obesity  Discharge Diagnoses: same  Operations: Procedure(s): LAPAROSCOPIC GASTRIC SLEEVE RESECTION WITH REPAIR OF HIATAL HERNIA REPAIR UPPER GI ENDOSCOPY  Admission Condition: good  Discharged Condition: good  Indication for Admission: patient has chronic morbid obesity unresponsive to medical management and presents at a BMI of 44 with comorbidities of chronic joint pain. After extensive preoperative workup and discussion detailed elsewhere she is electively admitted for sleeve gastrectomy for treatment of her morbid obesity.  Hospital Course: On the morning of admission the patient underwent an uneventful sleeve gastrectomy.Her postoperative course was entirely benign without complications. On the second postoperative day she is tolerating protein shakes. Abdomen is benign. Vital signs stable. Wounds healing well.   Disposition: Home  Patient Instructions:    Medication List    TAKE these medications        acetaminophen 500 MG tablet  Commonly known as:  TYLENOL  Take 1,000 mg by mouth every 6 (six) hours as needed for mild pain or moderate pain.     ALPRAZolam 0.5 MG tablet  Commonly known as:  XANAX  Take 0.5 mg by mouth 2 (two) times daily as needed for anxiety.     diclofenac sodium 1 % Gel  Commonly known as:  VOLTAREN  Apply 1 application topically daily as needed (pain).     zolpidem 10 MG tablet  Commonly known as:  AMBIEN  Take 10 mg by mouth at bedtime as needed for sleep.        Activity: activity as tolerated Diet: bariatric protein shakes Wound Care: none needed  Follow-up:  With Dr. Johna SheriffHoxworth in 3 weeks.  Signed: Mariella SaaBenjamin T Cadarius Nevares MD, FACS  11/01/2015, 8:20 AM

## 2015-11-01 NOTE — Progress Notes (Signed)
Patient alert and oriented, pain controlled at tolerable level for patient. Vital signs stable.  Patient given discharge instructions. Patient verbalized understanding of instructions. All questions and concerns answered.

## 2015-11-01 NOTE — Progress Notes (Signed)
Patient alert and oriented, pain is controlled. Patient is tolerating fluids,  advanced to protein shake today, patient tolerated well.  Reviewed Gastric sleeve discharge instructions with patient and patient is able to articulate understanding.  Provided information on BELT program, Support Group and [facility] outpatient pharmacy. All questions answered, will continue to monitor.  

## 2015-11-01 NOTE — Discharge Instructions (Signed)

## 2015-11-06 ENCOUNTER — Telehealth (HOSPITAL_COMMUNITY): Payer: Self-pay

## 2015-11-06 NOTE — Telephone Encounter (Addendum)
Attempted DROP discharge call, no answer, left message to return call   Made discharge phone call to patient per DROP protocol. Asking the following questions.    1. Do you have someone to care for you now that you are home?  yes 2. Are you having pain now that is not relieved by your pain medication?  no 3. Are you able to drink the recommended daily amount of fluids (48 ounces minimum/day) and protein (60-80 grams/day) as prescribed by the dietitian or nutritional counselor?  yes 4. Are you taking the vitamins and minerals as prescribed?  yes 5. Do you have the "on call" number to contact your surgeon if you have a problem or question?  yes 6. Are your incisions free of redness, swelling or drainage? (If steri strips, address that these can fall off, shower as tolerated) yes 7. Have your bowels moved since your surgery?  If not, are you passing gas?  No, yes 8. Are you up and walking 3-4 times per day?  yes

## 2015-11-14 ENCOUNTER — Encounter: Payer: Managed Care, Other (non HMO) | Attending: General Surgery

## 2015-11-14 VITALS — Ht 62.0 in | Wt 220.5 lb

## 2015-11-14 DIAGNOSIS — Z6841 Body Mass Index (BMI) 40.0 and over, adult: Secondary | ICD-10-CM | POA: Insufficient documentation

## 2015-11-14 DIAGNOSIS — Z713 Dietary counseling and surveillance: Secondary | ICD-10-CM | POA: Insufficient documentation

## 2015-11-14 NOTE — Progress Notes (Signed)
Bariatric Class:  Appt start time: 1530 end time:  1630.  2 Week Post-Operative Nutrition Class  Patient was seen on 11/14/15 for Post-Operative Nutrition education at [facility].   Surgery date: 10/30/2015 Surgery type: Sleeve gastrectomy Start weight at [facility]: 238 lbs on 09/12/15 Weight today: 220.5 lbs  Weight change: 17 lbs  TANITA  BODY COMP RESULTS  10/09/15 11/14/15   BMI (kg/m^2) 43.4 40.3   Fat Mass (lbs) 112.5 108.0   Fat Free Mass (lbs) 125 112.5   Total Body Water (lbs) 91.5 82.5    The following the learning objectives were met by the patient during this course:  Identifies Phase 3A (Soft, High Proteins) Dietary Goals and will begin from 2 weeks post-operatively to 2 months post-operatively  Identifies appropriate sources of fluids and proteins   States protein recommendations and appropriate sources post-operatively  Identifies the need for appropriate texture modifications, mastication, and bite sizes when consuming solids  Identifies appropriate multivitamin and calcium sources post-operatively  Describes the need for physical activity post-operatively and will follow MD recommendations  States when to call healthcare provider regarding medication questions or post-operative complications  Handouts given during class include:  Phase 3A: Soft, High Protein Diet Handout  Follow-Up Plan: Patient will follow-up at [facility] in 6 weeks for 2 month post-op nutrition visit for diet advancement per MD.

## 2015-12-26 ENCOUNTER — Encounter: Payer: Self-pay | Admitting: Dietician

## 2015-12-26 ENCOUNTER — Encounter: Payer: Managed Care, Other (non HMO) | Attending: General Surgery | Admitting: Dietician

## 2015-12-26 DIAGNOSIS — Z6841 Body Mass Index (BMI) 40.0 and over, adult: Secondary | ICD-10-CM | POA: Diagnosis not present

## 2015-12-26 DIAGNOSIS — Z713 Dietary counseling and surveillance: Secondary | ICD-10-CM | POA: Diagnosis not present

## 2015-12-26 NOTE — Patient Instructions (Signed)
Goals:  Follow Phase 3B: High Protein + Non-Starchy Vegetables  Eat 3-6 small meals/snacks, every 3-5 hrs  Increase lean protein foods to meet 60g goal  Increase fluid intake to 64oz +  Avoid drinking 15 minutes before, during and 30 minutes after eating  Aim for >30 min of physical activity daily  Surgery date: 10/30/2015 Surgery type: Sleeve gastrectomy [facility]: 238 lbs on 09/12/15 Weight today: 206 lbs Weight change: 14.5 lbs Total weight lost: 32 lbs  TANITA  BODY COMP RESULTS  10/09/15 11/14/15 12/26/15   BMI (kg/m^2) 43.4 40.3 37.7   Fat Mass (lbs) 112.5 108.0 95   Fat Free Mass (lbs) 125 112.5 111   Total Body Water (lbs) 91.5 82.5 81.5

## 2015-12-26 NOTE — Progress Notes (Signed)
  Follow-up visit:  8 Weeks Post-Operative Sleeve gatsrectomy Surgery  Medical Nutrition Therapy:  Appt start time: 1100 end time:  1125.  Primary concerns today: Post-operative Bariatric Surgery Nutrition Management. Denise Summers returns having lost another 14.5 lbs. Able to tolerate only about an ounce of meat at a time. Still drinking 2 Premier protein shakes per day.    Surgery date: 10/30/2015 Surgery type: Sleeve gastrectomy [facility] lbs on 09/12/15 Weight today: 206 lbs Weight change: 14.5 lbs Total weight lost: 32 lbs   TANITA  BODY COMP RESULTS  10/09/15 11/14/15 12/26/15   BMI (kg/m^2) 43.4 40.3 37.7   Fat Mass (lbs) 112.5 108.0 95   Fat Free Mass (lbs) 125 112.5 111   Total Body Water (lbs) 91.5 82.5 81.5     Preferred Learning Style:  No preference indicated   Learning Readiness:   Ready  24-hr recall:  2 Premier protein shakes per day   B (5AM): Premier protein shake (30g) Snk (7AM): a few bites of egg and cheese (5g)  Snk (11AM): string cheese (6g) L (PM): ham and cheese rollup or chili (10g) Snk (PM): Premier protein shake  (30g) D (PM): see lunch (10g) Snk (PM): cheese (6g)  Fluid intake: 64-100 oz water and protein shake Estimated total protein intake: 90-100g  Medications: see list Supplementation: taking  Using straws: no Drinking while eating: no Hair loss: none Carbonated beverages: no N/V/D/C: vomited after 1 bite too many; constipation, taking Miralax and fiber supplement Dumping syndrome: none  Recent physical activity:  4 days a week, 1 hour cardio + weight training   Progress Towards Goal(s):  In progress.  Handouts given during visit include:  Phase 3B lean protein + non starchy vegetables   Nutritional Diagnosis:  [name]-3.3 Overweight/obesity related to past poor dietary habits and physical inactivity as evidenced by patient w/ recent sleeve gastrectomy surgery following dietary guidelines for continued weight  loss.     Intervention:  Nutrition counseling provided.  Teaching Method Utilized:  Visual Auditory Hands on  Barriers to learning/adherence to lifestyle change: none  Demonstrated degree of understanding via:  Teach Back   Monitoring/Evaluation:  Dietary intake, exercise, and body weight. Follow up in 6 weeks for 3.5 month post-op visit.

## 2016-02-13 ENCOUNTER — Ambulatory Visit: Payer: Self-pay | Admitting: Dietician

## 2016-03-05 ENCOUNTER — Ambulatory Visit: Payer: Self-pay | Admitting: Dietician

## 2016-04-02 ENCOUNTER — Encounter: Payer: Self-pay | Admitting: Dietician

## 2016-04-02 ENCOUNTER — Encounter: Payer: Managed Care, Other (non HMO) | Attending: General Surgery | Admitting: Dietician

## 2016-04-02 DIAGNOSIS — Z6830 Body mass index (BMI) 30.0-30.9, adult: Secondary | ICD-10-CM | POA: Insufficient documentation

## 2016-04-02 DIAGNOSIS — Z713 Dietary counseling and surveillance: Secondary | ICD-10-CM | POA: Insufficient documentation

## 2016-04-02 NOTE — Patient Instructions (Signed)
Goals:  Follow Phase 3B: High Protein + Non-Starchy Vegetables  Eat 3-6 small meals/snacks, every 3-5 hrs  Increase lean protein foods to meet 60g goal  Increase fluid intake to 64oz +  Avoid drinking 15 minutes before, during and 30 minutes after eating  Aim for >30 min of physical activity daily  Surgery date: 10/30/2015 Surgery type: Sleeve gastrectomy [facility]: [facility] lbs on 09/12/15 (highest weight 266 lbs per patient) Weight today: 165 lbs Weight change: 41 lbs Total weight lost: 73 lbs (101 lbs) Goal weight: 150 lbs   TANITA  BODY COMP RESULTS  10/09/15 11/14/15 12/26/15 04/02/16   BMI (kg/m^2) 43.4 40.3 37.7 30.2   Fat Mass (lbs) 112.5 108.0 95 57.5   Fat Free Mass (lbs) 125 112.5 111 107.5   Total Body Water (lbs) 91.5 82.5 81.5 78.5

## 2016-04-02 NOTE — Progress Notes (Signed)
  Follow-up visit:  5 months Post-Operative Sleeve gastrectomy Surgery  Medical Nutrition Therapy:  Appt start time: 1020 end time:  1040  Primary concerns today: Post-operative Bariatric Surgery Nutrition Management. Huntley DecSara returns having lost another 41 lbs in the last 3 months. Still unable to eat many vegetables. Still drinks 1-2 protein shakes per day. Not feeling hungry or deprived. Not craving sweets. Going on a cruise in a few weeks. She weighs herself every morning and knows this is not recommended.   Non scale victories: 5k, more energy and loving life, feeling pretty in dresses    Surgery date: 10/30/2015 Surgery type: Sleeve gastrectomy [facility] lbs on 09/12/15 (highest weight 266 lbs per patient) Weight today: 165 lbs Weight change: 41 lbs Total weight lost: 73 lbs (101 lbs) Goal weight: 150 lbs   TANITA  BODY COMP RESULTS  10/09/15 11/14/15 12/26/15 04/02/16   BMI (kg/m^2) 43.4 40.3 37.7 30.2   Fat Mass (lbs) 112.5 108.0 95 57.5   Fat Free Mass (lbs) 125 112.5 111 107.5   Total Body Water (lbs) 91.5 82.5 81.5 78.5     Preferred Learning Style:  No preference indicated   Learning Readiness:   Ready  24-hr recall:  2 Premier protein shakes per day   B (4-6AM): Premier protein shake (30g) Snk (7AM): egg and cheese (8g)  L (PM): 1 oz chicken or chili beans (10g) Snk (PM): Premier protein shake  (30g) D (PM): see lunch (10g) Snk (PM): sometimes sugar free pudding or popsicle  Fluid intake: 64-100 oz water and protein shake Estimated total protein intake: 90-100g  Medications: none Supplementation: taking  Using straws: no Drinking while eating: no Hair loss: none Carbonated beverages: no N/V/D/C: constipation resolving; does not have to take Miralax as often Dumping syndrome: none  Recent physical activity:  3 days of kickboxing for 30 minutes, walking 5 miles and arm exercises on other days   Progress Towards Goal(s):  In  progress.  Handouts given during visit include:  none   Nutritional Diagnosis:  [place]-3.3 Overweight/obesity related to past poor dietary habits and physical inactivity as evidenced by patient w/ recent sleeve gastrectomy surgery following dietary guidelines for continued weight loss.     Intervention:  Nutrition counseling provided.  Teaching Method Utilized:  Visual Auditory Hands on  Barriers to learning/adherence to lifestyle change: none  Demonstrated degree of understanding via:  Teach Back   Monitoring/Evaluation:  Dietary intake, exercise, and body weight. Follow up in 6 months for 11 month post-op visit.

## 2016-04-16 ENCOUNTER — Other Ambulatory Visit: Payer: Self-pay | Admitting: Obstetrics and Gynecology

## 2016-04-18 LAB — CYTOLOGY - PAP

## 2016-04-19 ENCOUNTER — Other Ambulatory Visit: Payer: Self-pay | Admitting: Obstetrics and Gynecology

## 2016-04-19 DIAGNOSIS — R928 Other abnormal and inconclusive findings on diagnostic imaging of breast: Secondary | ICD-10-CM

## 2016-05-07 ENCOUNTER — Ambulatory Visit
Admission: RE | Admit: 2016-05-07 | Discharge: 2016-05-07 | Disposition: A | Discharge status: Home / Self Care | Payer: Managed Care, Other (non HMO) | Source: Ambulatory Visit | Attending: Obstetrics and Gynecology | Admitting: Obstetrics and Gynecology

## 2016-05-07 ENCOUNTER — Other Ambulatory Visit: Payer: Self-pay | Admitting: Obstetrics and Gynecology

## 2016-05-07 DIAGNOSIS — R928 Other abnormal and inconclusive findings on diagnostic imaging of breast: Secondary | ICD-10-CM

## 2016-05-09 ENCOUNTER — Other Ambulatory Visit: Payer: Self-pay | Admitting: Obstetrics and Gynecology

## 2016-05-09 ENCOUNTER — Ambulatory Visit
Admission: RE | Admit: 2016-05-09 | Discharge: 2016-05-09 | Disposition: A | Discharge status: Home / Self Care | Payer: Managed Care, Other (non HMO) | Source: Ambulatory Visit | Attending: Obstetrics and Gynecology | Admitting: Obstetrics and Gynecology

## 2016-05-09 DIAGNOSIS — R928 Other abnormal and inconclusive findings on diagnostic imaging of breast: Secondary | ICD-10-CM

## 2016-10-01 ENCOUNTER — Ambulatory Visit: Payer: Self-pay | Admitting: Dietician

## 2017-09-09 DIAGNOSIS — N938 Other specified abnormal uterine and vaginal bleeding: Secondary | ICD-10-CM | POA: Insufficient documentation

## 2018-01-07 ENCOUNTER — Encounter (HOSPITAL_COMMUNITY): Payer: Self-pay

## 2018-01-07 ENCOUNTER — Emergency Department (HOSPITAL_COMMUNITY)
Admission: EM | Admit: 2018-01-07 | Discharge: 2018-01-08 | Disposition: A | Discharge status: Home / Self Care | Payer: Managed Care, Other (non HMO) | Attending: Emergency Medicine | Admitting: Emergency Medicine

## 2018-01-07 DIAGNOSIS — F331 Major depressive disorder, recurrent, moderate: Secondary | ICD-10-CM | POA: Insufficient documentation

## 2018-01-07 DIAGNOSIS — Z79899 Other long term (current) drug therapy: Secondary | ICD-10-CM | POA: Insufficient documentation

## 2018-01-07 DIAGNOSIS — R45851 Suicidal ideations: Secondary | ICD-10-CM | POA: Insufficient documentation

## 2018-01-07 DIAGNOSIS — F329 Major depressive disorder, single episode, unspecified: Secondary | ICD-10-CM | POA: Diagnosis present

## 2018-01-07 DIAGNOSIS — F32A Depression, unspecified: Secondary | ICD-10-CM

## 2018-01-07 LAB — RAPID URINE DRUG SCREEN, HOSP PERFORMED
Amphetamines: NOT DETECTED
BARBITURATES: NOT DETECTED
Benzodiazepines: NOT DETECTED
Cocaine: NOT DETECTED
Opiates: POSITIVE — AB
Tetrahydrocannabinol: NOT DETECTED

## 2018-01-07 LAB — SALICYLATE LEVEL: Salicylate Lvl: <7 mg/dL (ref 2.8–30.0)

## 2018-01-07 LAB — COMPREHENSIVE METABOLIC PANEL
ALBUMIN: 3.3 g/dL — AB (ref 3.5–5.0)
ALK PHOS: 85 U/L (ref 38–126)
ALT: 18 U/L (ref 14–54)
AST: 22 U/L (ref 15–41)
Anion gap: 9 (ref 5–15)
BILIRUBIN TOTAL: 0.3 mg/dL (ref 0.3–1.2)
BUN: 9 mg/dL (ref 6–20)
CALCIUM: 8.6 mg/dL — AB (ref 8.9–10.3)
CO2: 26 mmol/L (ref 22–32)
Chloride: 103 mmol/L (ref 101–111)
Creatinine, Ser: 0.66 mg/dL (ref 0.44–1.00)
GFR calc Af Amer: >60 mL/min (ref >60)
GLUCOSE: 98 mg/dL (ref 65–99)
POTASSIUM: 3.7 mmol/L (ref 3.5–5.1)
Sodium: 138 mmol/L (ref 135–145)
TOTAL PROTEIN: 6.9 g/dL (ref 6.5–8.1)

## 2018-01-07 LAB — CBC
HEMATOCRIT: 34.3 % — AB (ref 36.0–46.0)
Hemoglobin: 11.1 g/dL — ABNORMAL LOW (ref 12.0–15.0)
MCH: 32.3 pg (ref 26.0–34.0)
MCHC: 32.4 g/dL (ref 30.0–36.0)
MCV: 99.7 fL (ref 78.0–100.0)
PLATELETS: 456 10*3/uL — AB (ref 150–400)
RBC: 3.44 MIL/uL — ABNORMAL LOW (ref 3.87–5.11)
RDW: 13.5 % (ref 11.5–15.5)
WBC: 7.9 10*3/uL (ref 4.0–10.5)

## 2018-01-07 LAB — I-STAT BETA HCG BLOOD, ED (MC, WL, AP ONLY)

## 2018-01-07 LAB — ACETAMINOPHEN LEVEL: Acetaminophen (Tylenol), Serum: <10 ug/mL — ABNORMAL LOW (ref 10–30)

## 2018-01-07 LAB — ETHANOL: ALCOHOL ETHYL (B): 39 mg/dL — AB (ref <10)

## 2018-01-07 MED ORDER — ZOLPIDEM TARTRATE 5 MG PO TABS: 5.0000 mg | ORAL_TABLET | Freq: Every evening | ORAL | Status: DC | PRN

## 2018-01-07 MED ORDER — ONDANSETRON HCL 4 MG PO TABS: 4.0000 mg | ORAL_TABLET | Freq: Three times a day (TID) | ORAL | Status: DC | PRN

## 2018-01-07 MED ORDER — ACETAMINOPHEN 325 MG PO TABS: 650.0000 mg | ORAL_TABLET | ORAL | Status: DC | PRN

## 2018-01-07 MED ORDER — ACETAMINOPHEN 325 MG PO TABS
650.0000 mg | ORAL_TABLET | Freq: Once | ORAL | Status: AC
  Administered 2018-01-07: 650 mg via ORAL
  Filled 2018-01-07: qty 2

## 2018-01-07 MED ORDER — VENLAFAXINE HCL ER 75 MG PO CP24: 150.0000 mg | ORAL_CAPSULE | Freq: Every day | ORAL | Status: DC

## 2018-01-07 MED ORDER — ALUM & MAG HYDROXIDE-SIMETH 200-200-20 MG/5ML PO SUSP: 30.0000 mL | Freq: Four times a day (QID) | ORAL | Status: DC | PRN

## 2018-01-07 MED ORDER — ADULT MULTIVITAMIN W/MINERALS CH: 1.0000 | ORAL_TABLET | Freq: Every day | ORAL | Status: DC

## 2018-01-07 MED ORDER — ALPRAZOLAM 0.5 MG PO TABS: 0.5000 mg | ORAL_TABLET | Freq: Two times a day (BID) | ORAL | Status: DC | PRN

## 2018-01-07 NOTE — ED Triage Notes (Signed)
Pt brought in voluntarily with the police, pt is very tearful and depressed, she says that she doesn't know how much more she can take Pt was fired about three weeks ago prior to a major surgery Pt's daughter isn't talking to her much and that really upsets her Pt has a sick mother that she helps take care of Pt says that she's been crying for three days

## 2018-01-07 NOTE — ED Provider Notes (Signed)
[facility]-EMERGENCY DEPT Provider Note   CSN: [id number] Arrival date & time: 01/07/18  2001     History   Chief Complaint Chief Complaint  Patient presents with  . Suicidal    HPI Denise Summers is a 47 y.o. female with a hx of GERD who presents to the ED for medical clearance.  Patient brought in voluntarily by police.  Patient states she has been depressed and feels as if her world is falling apart most significantly over the past 3 days, states she has been persistently tearful during this time.  Patient states "I do not know how much more I can take"- no frank SI upon my discussion with patient, however there was some concern for SI by police.  She states she has had a lot of life stressors recently including being fired 3 weeks ago prior to major surgery and family related issues.  States that she had plastic surgery including breast reconstruction and a tummy tuck.  Had appointment with surgeon today for drains to be removed.  Not having any issues related to the surgical procedure.  At present her only other complaint is that she has a gradual onset headache, similar to previous headaches.  States that is located in the frontal region and it is a throbbing sensation.  SHe feels this is related to her crying.  Denies change in vision, numbness, weakness, dizziness, or lightheadedness.  She denies SI, HI, or hallucinations.  Patient is not having any chest pain, difficulty breathing, abdominal pain, fever, chills, or N/V/D.   HPI  Past Medical History:  Diagnosis Date  . Constipation   . GERD (gastroesophageal reflux disease)    silent reflux  . Obesity     Patient Active Problem List   Diagnosis Date Noted  . Morbid obesity with BMI of 40.0-44.9, adult (HCC) 10/30/2015    Past Surgical History:  Procedure Laterality Date  . CHOLECYSTECTOMY    . ENDOMETRIAL ABLATION    . ESOPHAGOGASTRODUODENOSCOPY (EGD) WITH PROPOFOL N/A 07/14/2014   Procedure:  ESOPHAGOGASTRODUODENOSCOPY (EGD) WITH PROPOFOL;  Surgeon: Charna Elizabeth, MD;  Location: [facility];  Service: Endoscopy;  Laterality: N/A;  . GASTRIC BYPASS    . LAPAROSCOPIC GASTRIC SLEEVE RESECTION WITH HIATAL HERNIA REPAIR N/A 10/30/2015   Procedure: LAPAROSCOPIC GASTRIC SLEEVE RESECTION WITH REPAIR OF HIATAL HERNIA REPAIR;  Surgeon: Glenna Fellows, MD;  Location: [facility];  Service: General;  Laterality: N/A;  . TUBAL LIGATION    . UPPER GI ENDOSCOPY N/A 10/30/2015   Procedure: UPPER GI ENDOSCOPY;  Surgeon: Glenna Fellows, MD;  Location: [facility]S;  Service: General;  Laterality: N/A;    OB History    No data available       Home Medications    Prior to Admission medications   Medication Sig Start Date End Date Taking? Authorizing Provider  acetaminophen (TYLENOL) 500 MG tablet Take 1,000 mg by mouth every 6 (six) hours as needed for mild pain or moderate pain.     [provider]  ALPRAZolam Prudy Feeler) 0.5 MG tablet Take 0.5 mg by mouth 2 (two) times daily as needed for anxiety.  11/27/14   [provider]  diclofenac sodium (VOLTAREN) 1 % GEL Apply 1 application topically daily as needed (pain).  08/14/15   [provider]  zolpidem (AMBIEN) 10 MG tablet Take 10 mg by mouth at bedtime as needed for sleep.    [provider]    Family History History reviewed. No pertinent family history.  Social History Social History   Tobacco Use  . Smoking status: Never Smoker  . Smokeless tobacco: Never Used  Substance Use Topics  . Alcohol use: Yes    Comment: occasional  . Drug use: No     Allergies   Patient has no known allergies.   Review of Systems Review of Systems  Constitutional: Negative for chills and fever.  Eyes: Negative for visual disturbance.  Respiratory: Negative for shortness of breath.   Cardiovascular: Negative for chest pain.  Gastrointestinal: Negative for abdominal pain, constipation, diarrhea and vomiting.    Neurological: Positive for headaches. Negative for dizziness, weakness, light-headedness and numbness.  Psychiatric/Behavioral: Negative for suicidal ideas.       Positive for depression and tearfulness.  All other systems reviewed and are negative.    Physical Exam Updated Vital Signs BP 107/65 (BP Location: Left Arm)   Pulse 88   Temp 98.4 F (36.9 C) (Oral)   Resp 16   SpO2 100%   Physical Exam  Constitutional: She appears well-developed and well-nourished. No distress.  HENT:  Head: Normocephalic and atraumatic.  Eyes: Conjunctivae and EOM are normal. Pupils are equal, round, and reactive to light. Right eye exhibits no discharge. Left eye exhibits no discharge.  Neck: Normal range of motion. Neck supple. No neck rigidity.  Cardiovascular: Normal rate and regular rhythm.  No murmur heard. Pulmonary/Chest: Breath sounds normal. No respiratory distress. She has no wheezes. She has no rales.  There is a surgical incision site to the right posterior chest wall- bandage in place, this was checked by her surgeon earlier today.    Abdominal: Soft. She exhibits no distension. There is no tenderness.  Patient with surgical incision site in the suprapubic region-bandage in place, this was checked by her surgeon earlier today.  Neurological: She is alert.  Clear speech.  Sensation grossly intact bilateral upper and lower extremities.  5 out of 5 grip strength 5 out of 5 plantar and dorsiflexion strength bilaterally.  Patient has steady normal gait.  Skin: Skin is warm and dry. No rash noted.  Psychiatric: She has a normal mood and affect. Her behavior is normal.  Nursing note and vitals reviewed.   ED Treatments / Results  Labs Results for orders placed or performed during the hospital encounter of 01/07/18  Comprehensive metabolic panel  Result Value Ref Range   Sodium 138 135 - 145 mmol/L   Potassium 3.7 3.5 - 5.1 mmol/L   Chloride 103 101 - 111 mmol/L   CO2 26 22 - 32 mmol/L    Glucose, Bld 98 65 - 99 mg/dL   BUN 9 6 - 20 mg/dL   Creatinine, Ser 9.14 0.44 - 1.00 mg/dL   Calcium 8.6 (L) 8.9 - 10.3 mg/dL   Total Protein 6.9 6.5 - 8.1 g/dL   Albumin 3.3 (L) 3.5 - 5.0 g/dL   AST 22 15 - 41 U/L   ALT 18 14 - 54 U/L   Alkaline Phosphatase 85 38 - 126 U/L   Total Bilirubin 0.3 0.3 - 1.2 mg/dL   GFR calc non Af Amer >60 >60 mL/min   GFR calc Af Amer >60 >60 mL/min   Anion gap 9 5 - 15  Ethanol  Result Value Ref Range   Alcohol, Ethyl (B) 39 (H) <10 mg/dL  Salicylate level  Result Value Ref Range   Salicylate Lvl <7.0 2.8 - 30.0 mg/dL  Acetaminophen level  Result Value Ref Range   Acetaminophen (Tylenol), Serum <10 (L)  10 - 30 ug/mL  cbc  Result Value Ref Range   WBC 7.9 4.0 - 10.5 K/uL   RBC 3.44 (L) 3.87 - 5.11 MIL/uL   Hemoglobin 11.1 (L) 12.0 - 15.0 g/dL   HCT 40.934.3 (L) 81.136.0 - 91.446.0 %   MCV 99.7 78.0 - 100.0 fL   MCH 32.3 26.0 - 34.0 pg   MCHC 32.4 30.0 - 36.0 g/dL   RDW 78.213.5 95.611.5 - 21.315.5 %   Platelets 456 (H) 150 - 400 K/uL  Rapid urine drug screen (hospital performed)  Result Value Ref Range   Opiates POSITIVE (A) NONE DETECTED   Cocaine NONE DETECTED NONE DETECTED   Benzodiazepines NONE DETECTED NONE DETECTED   Amphetamines NONE DETECTED NONE DETECTED   Tetrahydrocannabinol NONE DETECTED NONE DETECTED   Barbiturates NONE DETECTED NONE DETECTED  I-Stat beta hCG blood, ED  Result Value Ref Range   I-stat hCG, quantitative <5.0 <5 mIU/mL   Comment 3           No results found. EKG  EKG Interpretation None      Radiology No results found.  Procedures Procedures (including critical care time)  Medications Ordered in ED Medications  acetaminophen (TYLENOL) tablet 650 mg (not administered)  zolpidem (AMBIEN) tablet 5 mg (not administered)  ondansetron (ZOFRAN) tablet 4 mg (not administered)  alum & mag hydroxide-simeth (MAALOX/MYLANTA) 200-200-20 MG/5ML suspension 30 mL (not administered)  acetaminophen (TYLENOL) tablet 650 mg (650 mg  Oral Given 01/07/18 2157)     Initial Impression / Assessment and Plan / ED Course  I have reviewed the triage vital signs and the nursing notes.  Pertinent labs & imaging results that were available during my care of the patient were reviewed by me and considered in my medical decision making (see chart for details).  Patient presents with feelings of depression/hopelessness/teariness needing medical clearance for TTS evaluation.  Patient's only other complaints at this time is a headache. Patient is nontoxic appearing with vitals WNL. Patient has hx of similar headaches, gradual onset with steady progression in severity- non concerning for [facility], ICH, ischemic CVA, acute glaucoma, giant cell arteritis, mass, or meningitis. Pt is afebrile with no focal neuro deficits, dizziness, change in vision, or nuchal rigidity. Patient treated for headache with Tylenol.  Screening labs grossly unremarkable, revealed anemia with hgb 11.1- similar to previous lab work.  Patient is medically cleared for TTS evaluation.   Final Clinical Impressions(s) / ED Diagnoses   Final diagnoses:  None    ED Discharge Orders    None       Desmond Lopeetrucelli, Aunya Lemler R, PA-C 01/07/18 2254    Rolland PorterJames, Mark, MD 01/07/18 2337
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
## 2018-01-07 NOTE — ED Notes (Signed)
[facility]: [facility] Expected date:  Expected time:  Means of arrival:  Comments: 

## 2018-01-08 NOTE — BH Assessment (Signed)
[facility] Assessment Progress Note   Pt signed no harm contract and was given outpatient resources.  Pt being discharged home.

## 2018-01-08 NOTE — ED Notes (Signed)
Pt declined d/c vitals and stated that she had no further questions for the PA.

## 2018-01-08 NOTE — ED Provider Notes (Signed)
Patient initially seen and evaluated by Harvie HeckSamantha Petrucelli, PA-C, for depression and passive SI. Medically cleared and evaluated by TTS and psychiatric provider who feel the patient is appropriate for discharge home with outpatient resources.   Physical Exam  BP 107/65 (BP Location: Left Arm)   Pulse 88   Temp 98.4 F (36.9 C) (Oral)   Resp 16   SpO2 100%   Physical Exam  Patient is awake and alert. She is in NAD. She denies SI, endorses depression.  ED Course/Procedures     Procedures  MDM  She feels safe going home. Marcus with TTS to provide resources for outpatient follow up and counseling. The patient will be staying with a friend for several days and will return to the ED if she does not continue to feel safe at home.        Elpidio AnisUpstill, Chesnie Capell, PA-C 01/08/18 0011    Rolland PorterJames, Mark, MD 01/17/18 [id number]

## 2019-10-14 ENCOUNTER — Encounter: Payer: Self-pay | Admitting: Certified Nurse Midwife

## 2019-10-29 ENCOUNTER — Other Ambulatory Visit: Payer: Self-pay

## 2019-11-03 ENCOUNTER — Encounter: Payer: Commercial Managed Care - PPO | Admitting: Certified Nurse Midwife

## 2019-11-24 ENCOUNTER — Other Ambulatory Visit: Payer: Self-pay

## 2019-11-24 ENCOUNTER — Encounter: Payer: Self-pay | Admitting: Certified Nurse Midwife

## 2019-11-24 ENCOUNTER — Other Ambulatory Visit: Payer: Self-pay | Admitting: Obstetrics and Gynecology

## 2019-11-24 ENCOUNTER — Other Ambulatory Visit: Payer: Self-pay | Admitting: Certified Nurse Midwife

## 2019-11-24 ENCOUNTER — Other Ambulatory Visit (HOSPITAL_COMMUNITY)
Admission: RE | Admit: 2019-11-24 | Discharge: 2019-11-24 | Disposition: A | Discharge status: Home / Self Care | Payer: Commercial Managed Care - PPO | Source: Ambulatory Visit | Attending: Obstetrics & Gynecology | Admitting: Obstetrics & Gynecology

## 2019-11-24 ENCOUNTER — Ambulatory Visit: Payer: Commercial Managed Care - PPO | Admitting: Certified Nurse Midwife

## 2019-11-24 VITALS — BP 116/64 | HR 70 | Temp 97.1°F | Resp 16 | Ht 61.75 in | Wt 150.0 lb

## 2019-11-24 DIAGNOSIS — Z124 Encounter for screening for malignant neoplasm of cervix: Secondary | ICD-10-CM

## 2019-11-24 DIAGNOSIS — B9689 Other specified bacterial agents as the cause of diseases classified elsewhere: Secondary | ICD-10-CM | POA: Insufficient documentation

## 2019-11-24 DIAGNOSIS — Z01411 Encounter for gynecological examination (general) (routine) with abnormal findings: Secondary | ICD-10-CM | POA: Diagnosis not present

## 2019-11-24 DIAGNOSIS — Z8659 Personal history of other mental and behavioral disorders: Secondary | ICD-10-CM

## 2019-11-24 DIAGNOSIS — N898 Other specified noninflammatory disorders of vagina: Secondary | ICD-10-CM | POA: Diagnosis not present

## 2019-11-24 DIAGNOSIS — Z1231 Encounter for screening mammogram for malignant neoplasm of breast: Secondary | ICD-10-CM

## 2019-11-24 DIAGNOSIS — N852 Hypertrophy of uterus: Secondary | ICD-10-CM

## 2019-11-24 DIAGNOSIS — Z9889 Other specified postprocedural states: Secondary | ICD-10-CM

## 2019-11-24 DIAGNOSIS — N938 Other specified abnormal uterine and vaginal bleeding: Secondary | ICD-10-CM

## 2019-11-24 NOTE — Progress Notes (Signed)
48 y.o. [id number] Single  Caucasian Fe here to establish gyn care and for annual exam. Contraception: tubal ligation. Recent move back to area from [place] and re-establishing care in [place]. Patient has not had Gyn exam since 2016. Patient here with concern of spotting which started 4 weeks ago ( history of ablation mid to late 20's, for menorrhagia/dysmenorrhea history) and has also had brown discharge and occasional bright red blood clot, Quarter size, no larger. Has also noted some cramping with this occurrence and vaginal odor. Denies any heavy bleeding. Has not noted any night sweats or hot flashes. Has noted some hair loss and feels this may be coming from gastric sleeve surgery in 2016. Weight loss of 95 pounds. Patient had extensive had breast reduction with implants and abdominoplasty after weight loss stable. Mammogram not current plans to schedule soon. Taking Lamictal for depression with PCP management. Sees PCP Dr. London Pepper for aex and labs. No other health concerns today.  No LMP recorded. Patient has had an ablation.          Sexually active: Yes.    The current method of family planning is tubal ligation.    Exercising: No.  exercise Smoker:  no  Review of Systems  Constitutional: Negative.        Spotting/bleeding for 4 weeks, vaginal odor & cramping  HENT: Negative.   Eyes: Negative.   Respiratory: Negative.   Cardiovascular: Negative.   Gastrointestinal: Negative.   Genitourinary: Negative.   Musculoskeletal: Negative.   Skin: Negative.   Neurological: Negative.   Endo/Heme/Allergies: Negative.   Psychiatric/Behavioral: Negative.     Health Maintenance: Pap:  04-16-16 neg HPV HR neg History of Abnormal Pap: no MMG:  2017 see reports Self Breast exams: yes Colonoscopy:  none BMD:   none TDaP:  Within 67yrs Shingles: no Pneumonia: not done Hep C and HIV: unsure Labs: if needed   reports that she has never smoked. She has never used smokeless tobacco. She reports  previous alcohol use. She reports that she does not use drugs.  Past Medical History:  Diagnosis Date  . Constipation   . Depression   . GERD (gastroesophageal reflux disease)    silent reflux  . Obesity     Past Surgical History:  Procedure Laterality Date  . CHOLECYSTECTOMY    . ENDOMETRIAL ABLATION    . ESOPHAGOGASTRODUODENOSCOPY (EGD) WITH PROPOFOL N/A 07/14/2014   Procedure: ESOPHAGOGASTRODUODENOSCOPY (EGD) WITH PROPOFOL;  Surgeon: Juanita Craver, MD;  Location: [facility];  Service: Endoscopy;  Laterality: N/A;  . GASTRIC BYPASS    . LAPAROSCOPIC GASTRIC SLEEVE RESECTION WITH HIATAL HERNIA REPAIR N/A 10/30/2015   Procedure: LAPAROSCOPIC GASTRIC SLEEVE RESECTION WITH REPAIR OF HIATAL HERNIA REPAIR;  Surgeon: Excell Seltzer, MD;  Location: [facility];  Service: General;  Laterality: N/A;  . TUBAL LIGATION    . UPPER GI ENDOSCOPY N/A 10/30/2015   Procedure: UPPER GI ENDOSCOPY;  Surgeon: Excell Seltzer, MD;  Location: [facility];  Service: General;  Laterality: N/A;    Current Outpatient Medications  Medication Sig Dispense Refill  . cholecalciferol (VITAMIN D) 1000 units tablet Take 1,000 Units by mouth daily.    . cyanocobalamin (,VITAMIN B-12,) 1000 MCG/ML injection Inject 1 mL into the skin every 30 (thirty) days.  1  . lamoTRIgine (LAMICTAL) 25 MG tablet Take 25 mg by mouth daily.    . Multiple Vitamin (MULTIVITAMIN WITH MINERALS) TABS tablet Take 1 tablet by mouth daily.     No current facility-administered medications for this visit.  Family History  Problem Relation Age of Onset  . Hypertension Mother   . Heart attack Father   . Heart disease Father   . Diabetes Brother   . Hypertension Brother   . Heart disease Brother   . Heart attack Brother   . Bone cancer Maternal Grandmother   . Stroke Maternal Grandfather     ROS:  Pertinent items are noted in HPI.  Otherwise, a comprehensive ROS was negative.  Exam:   BP 116/64   Pulse 70   Temp (!) 97.1 F (36.2  C) (Skin)   Resp 16   Ht 5' 1.75" (1.568 m)   Wt 150 lb (68 kg)   BMI 27.66 kg/m  Height: 5' 1.75" (156.8 cm) Ht Readings from Last 3 Encounters:  11/24/19 5' 1.75" (1.568 m)  04/02/16 5\' 2"  (1.575 m)  12/26/15 5\' 2"  (1.575 m)    General appearance: alert, cooperative and appears stated age Head: Normocephalic, without obvious abnormality, atraumatic Neck: no adenopathy, supple, symmetrical, trachea midline and thyroid normal to inspection and palpation Lungs: clear to auscultation bilaterally Breasts: normal appearance, no masses or tenderness, No nipple retraction or dimpling, No nipple discharge or bleeding, No axillary or supraclavicular adenopathy Heart: regular rate and rhythm Abdomen: soft, non-tender; no masses,  no organomegaly Extremities: extremities normal, atraumatic, no cyanosis or edema Skin: Skin color, texture, turgor normal. No rashes or lesions Lymph nodes: Cervical, supraclavicular, and axillary nodes normal. No abnormal inguinal nodes palpated Neurologic: Grossly normal   Pelvic: External genitalia:  no lesions              Urethra:  normal appearing urethra with no masses, tenderness or lesions              Bartholin's and Skene's: normal                 Vagina: normal appearing vagina with normal color and discharge, no lesions,  No odor noted, Affirm taken              Cervix: no cervical motion tenderness, no lesions and scant blood noted from cervix              Pap taken: Yes.   Bimanual Exam:  Uterus:  uterus tilts to left, nodular feel posterior slightly enlarged to palpation, non tender              Adnexa: normal adnexa, no mass, fullness, tenderness and difficult to palpate on left due abdominal surgery               Rectovaginal: Confirms               Anus:  normal sphincter tone, no lesions  Chaperone present: yes Cornelia CopaJoy Johnson  A:  Well Woman with normal exam  Contraception Tubal ligation  History of Endometrial ablation late 20's.  with  spotting and scant bleeding occurrence now for the first time since ablation.  Uterus slightly enlarged, nodular feel  ? Perimenopausal not symptomatic recommend FSH and TSH  R/O vaginal infection  History of Gastric sleeve with 95 pound weight loss  History of depression with PCP managment   Mammogram over due    P:   Reviewed health and wellness pertinent to exam  Discussed finding of exam. Discussed after ablation sometimes bleeding can occur, but should be evaluated. Also due to uterine feel of nodular could be fibroid present which can also increase risk of bleeding. Perimenopausal can also cause hormonal changes  with bleeding occurring. Recommend PUS agreeable. Would also recommend labs of FSH, TSH, CBC( anemia?) Will draw at PUS visit, patient needed to care for grandchild. Patient agreeable to plan. She will be called with PUS information and scheduled.  Lab: affirm  Continue follow up with MD as indicated.  Stressed importance of mammogram yearly and SBE.  Pap smear: yes   counseled on breast self exam, mammography screening, STD prevention, HIV risk factors and prevention, feminine hygiene, menopause, adequate intake of calcium and vitamin D, diet and exercise  return annually or prn  An After Visit Summary was printed and given to the patient.

## 2019-11-24 NOTE — Patient Instructions (Signed)
EXERCISE AND DIET:  We recommended that you start or continue a regular exercise program for good health. Regular exercise means any activity that makes your heart beat faster and makes you sweat.  We recommend exercising at least 30 minutes per day at least 3 days a week, preferably 4 or 5.  We also recommend a diet low in fat and sugar.  Inactivity, poor dietary choices and obesity can cause diabetes, heart attack, stroke, and kidney damage, among others.    ALCOHOL AND SMOKING:  Women should limit their alcohol intake to no more than 7 drinks/beers/glasses of wine (combined, not each!) per week. Moderation of alcohol intake to this level decreases your risk of breast cancer and liver damage. And of course, no recreational drugs are part of a healthy lifestyle.  And absolutely no smoking or even second hand smoke. Most people know smoking can cause heart and lung diseases, but did you know it also contributes to weakening of your bones? Aging of your skin?  Yellowing of your teeth and nails?  CALCIUM AND VITAMIN D:  Adequate intake of calcium and Vitamin D are recommended.  The recommendations for exact amounts of these supplements seem to change often, but generally speaking 600 mg of calcium (either carbonate or citrate) and 800 units of Vitamin D per day seems prudent. Certain women may benefit from higher intake of Vitamin D.  If you are among these women, your doctor will have told you during your visit.    PAP SMEARS:  Pap smears, to check for cervical cancer or precancers,  have traditionally been done yearly, although recent scientific advances have shown that most women can have pap smears less often.  However, every woman still should have a physical exam from her gynecologist every year. It will include a breast check, inspection of the vulva and vagina to check for abnormal growths or skin changes, a visual exam of the cervix, and then an exam to evaluate the size and shape of the uterus and  ovaries.  And after 48 years of age, a rectal exam is indicated to check for rectal cancers. We will also provide age appropriate advice regarding health maintenance, like when you should have certain vaccines, screening for sexually transmitted diseases, bone density testing, colonoscopy, mammograms, etc.   MAMMOGRAMS:  All women over 40 years old should have a yearly mammogram. Many facilities now offer a "3D" mammogram, which may cost around $50 extra out of pocket. If possible,  we recommend you accept the option to have the 3D mammogram performed.  It both reduces the number of women who will be called back for extra views which then turn out to be normal, and it is better than the routine mammogram at detecting truly abnormal areas.    COLONOSCOPY:  Colonoscopy to screen for colon cancer is recommended for all women at age 50.  We know, you hate the idea of the prep.  We agree, BUT, having colon cancer and not knowing it is worse!!  Colon cancer so often starts as a polyp that can be seen and removed at colonscopy, which can quite literally save your life!  And if your first colonoscopy is normal and you have no family history of colon cancer, most women don't have to have it again for 10 years.  Once every ten years, you can do something that may end up saving your life, right?  We will be happy to help you get it scheduled when you are ready.    Be sure to check your insurance coverage so you understand how much it will cost.  It may be covered as a preventative service at no cost, but you should check your particular policy.      Perimenopause  Perimenopause is the normal time of life before and after menstrual periods stop completely (menopause). Perimenopause can begin 2-8 years before menopause, and it usually lasts for 1 year after menopause. During perimenopause, the ovaries may or may not produce an egg. What are the causes? This condition is caused by a natural change in hormone levels that  happens as you get older. What increases the risk? This condition is more likely to start at an earlier age if you have certain medical conditions or treatments, including:  A tumor of the pituitary gland in the brain.  A disease that affects the ovaries and hormone production.  Radiation treatment for cancer.  Certain cancer treatments, such as chemotherapy or hormone (anti-estrogen) therapy.  Heavy smoking and excessive alcohol use.  Family history of early menopause. What are the signs or symptoms? Perimenopausal changes affect each woman differently. Symptoms of this condition may include:  Hot flashes.  Night sweats.  Irregular menstrual periods.  Decreased sex drive.  Vaginal dryness.  Headaches.  Mood swings.  Depression.  Memory problems or trouble concentrating.  Irritability.  Tiredness.  Weight gain.  Anxiety.  Trouble getting pregnant. How is this diagnosed? This condition is diagnosed based on your medical history, a physical exam, your age, your menstrual history, and your symptoms. Hormone tests may also be done. How is this treated? In some cases, no treatment is needed. You and your health care provider should make a decision together about whether treatment is necessary. Treatment will be based on your individual condition and preferences. Various treatments are available, such as:  Menopausal hormone therapy (MHT).  Medicines to treat specific symptoms.  Acupuncture.  Vitamin or herbal supplements. Before starting treatment, make sure to let your health care provider know if you have a personal or family history of:  Heart disease.  Breast cancer.  Blood clots.  Diabetes.  Osteoporosis. Follow these instructions at home: Lifestyle  Do not use any products that contain nicotine or tobacco, such as cigarettes and e-cigarettes. If you need help quitting, ask your health care provider.  Eat a balanced diet that includes fresh  fruits and vegetables, whole grains, soybeans, eggs, lean meat, and low-fat dairy.  Get at least 30 minutes of physical activity on 5 or more days each week.  Avoid alcoholic and caffeinated beverages, as well as spicy foods. This may help prevent hot flashes.  Get 7-8 hours of sleep each night.  Dress in layers that can be removed to help you manage hot flashes.  Find ways to manage stress, such as deep breathing, meditation, or journaling. General instructions  Keep track of your menstrual periods, including: ? When they occur. ? How heavy they are and how long they last. ? How much time passes between periods.  Keep track of your symptoms, noting when they start, how often you have them, and how long they last.  Take over-the-counter and prescription medicines only as told by your health care provider.  Take vitamin supplements only as told by your health care provider. These may include calcium, vitamin E, and vitamin D.  Use vaginal lubricants or moisturizers to help with vaginal dryness and improve comfort during sex.  Talk with your health care provider before starting any herbal supplements.    Keep all follow-up visits as told by your health care provider. This is important. This includes any group therapy or counseling. Contact a health care provider if:  You have heavy vaginal bleeding or pass blood clots.  Your period lasts more than 2 days longer than normal.  Your periods are recurring sooner than 21 days.  You bleed after having sex. Get help right away if:  You have chest pain, trouble breathing, or trouble talking.  You have severe depression.  You have pain when you urinate.  You have severe headaches.  You have vision problems. Summary  Perimenopause is the time when a woman's body begins to move into menopause. This may happen naturally or as a result of other health problems or medical treatments.  Perimenopause can begin 2-8 years before  menopause, and it usually lasts for 1 year after menopause.  Perimenopausal symptoms can be managed through medicines, lifestyle changes, and complementary therapies such as acupuncture. This information is not intended to replace advice given to you by your health care provider. Make sure you discuss any questions you have with your health care provider. Document Released: 01/09/2005 Document Revised: 11/14/2017 Document Reviewed: 01/07/2017 Elsevier Patient Education  2020 [employer] Inc[employer]  

## 2019-11-25 ENCOUNTER — Telehealth: Payer: Self-pay | Admitting: *Deleted

## 2019-11-25 DIAGNOSIS — N852 Hypertrophy of uterus: Secondary | ICD-10-CM

## 2019-11-25 DIAGNOSIS — Z9889 Other specified postprocedural states: Secondary | ICD-10-CM

## 2019-11-25 DIAGNOSIS — N938 Other specified abnormal uterine and vaginal bleeding: Secondary | ICD-10-CM

## 2019-11-25 LAB — VAGINITIS/VAGINOSIS, DNA PROBE
Candida Species: NEGATIVE
Gardnerella vaginalis: POSITIVE — AB
Trichomonas vaginosis: NEGATIVE

## 2019-11-25 NOTE — Telephone Encounter (Signed)
-----   Message from Denise Summers, CNM sent at 11/24/2019  1:15 PM EST ----- Patient needs PUS for slightly enlarged uterus, post ablation (in her 20's)  bleeding.Patient aware she will be called with information and scheduled

## 2019-11-25 NOTE — Telephone Encounter (Signed)
Spoke with patient. Patient request a late appt time. PUS scheduled for 11/30/19 at 4pm, consult at 4:30pm with Dr. Talbert Nan. Order placed for precert. Patient verbalizes understanding and is agreeable.   Routing to provider for final review. Patient is agreeable to disposition. Will close encounter.  Cc: Dr. Talbert Nan, [place], [place]

## 2019-11-26 ENCOUNTER — Telehealth: Payer: Self-pay | Admitting: Certified Nurse Midwife

## 2019-11-26 MED ORDER — METRONIDAZOLE 500 MG PO TABS: 500.0000 mg | ORAL_TABLET | Freq: Two times a day (BID) | ORAL | 0 refills | Status: DC

## 2019-11-26 NOTE — Telephone Encounter (Signed)
-----   Message from Regina Eck, CNM sent at 11/25/2019  4:21 PM EST ----- Notify patient her vaginal screen was negative for yeast and trichomonas, positive for BV.  She needs Rx Flagyl 500 mg bid x 7 avoid ETOH during treatment due to nausea. Be sure to follow instructions. Vaginal discharge can increase during treatment.

## 2019-11-26 NOTE — Telephone Encounter (Signed)
Spoke with patient, advised as seen below per Deborah Leonard, CNM. Rx for flagyl to verified pharmacy. ETOH precautions reviewed.  Patient verbalizes understanding and is agreeable.   Encounter closed.   

## 2019-11-26 NOTE — Telephone Encounter (Signed)
Patient sent the following correspondence through [place].  I have a question about VAGINITIS/VAGINOSIS, DNA PROBE resulted on 11/25/19, 9:36 AM.  So what do we do for the positive

## 2019-11-29 ENCOUNTER — Other Ambulatory Visit: Payer: Self-pay

## 2019-11-30 ENCOUNTER — Ambulatory Visit (INDEPENDENT_AMBULATORY_CARE_PROVIDER_SITE_OTHER): Payer: Commercial Managed Care - PPO

## 2019-11-30 ENCOUNTER — Ambulatory Visit: Payer: Commercial Managed Care - PPO | Admitting: Obstetrics and Gynecology

## 2019-11-30 ENCOUNTER — Encounter: Payer: Self-pay | Admitting: Obstetrics and Gynecology

## 2019-11-30 VITALS — BP 110/70 | HR 88 | Temp 97.3°F | Wt 151.0 lb

## 2019-11-30 DIAGNOSIS — N852 Hypertrophy of uterus: Secondary | ICD-10-CM | POA: Diagnosis not present

## 2019-11-30 DIAGNOSIS — N939 Abnormal uterine and vaginal bleeding, unspecified: Secondary | ICD-10-CM | POA: Diagnosis not present

## 2019-11-30 DIAGNOSIS — Z9889 Other specified postprocedural states: Secondary | ICD-10-CM

## 2019-11-30 DIAGNOSIS — N938 Other specified abnormal uterine and vaginal bleeding: Secondary | ICD-10-CM | POA: Diagnosis not present

## 2019-11-30 LAB — CYTOLOGY - PAP
Comment: NEGATIVE
Diagnosis: NEGATIVE
Diagnosis: REACTIVE
High risk HPV: NEGATIVE

## 2019-11-30 MED ORDER — DOXYCYCLINE HYCLATE 100 MG PO CAPS: 100.0000 mg | ORAL_CAPSULE | Freq: Two times a day (BID) | ORAL | 0 refills | Status: DC

## 2019-11-30 NOTE — Patient Instructions (Signed)
Call with any further bleeding. Would recommend endometrial biopsy.

## 2019-11-30 NOTE — Progress Notes (Signed)
GYNECOLOGY  VISIT   HPI: 48 y.o.   Single White or Caucasian Not Hispanic or Latino  female   [phone] with No LMP recorded. Patient has had an ablation.   here for consult following PUS.   She saw Mrs Darcel Bayley for an annual exam last week and c/o spotting light bleeding/spotting daily for the previous 4 weeks, she would pass small clot. The blood smelt bad. She hasn't had any bleeding for ~20 years, since the time of her endometrial ablation. The bleeding and odor stopped about a week ago.  Her affirm last week returned positive for BV, she is on flagyl. She wasn't tender on exam last week or with ultrasound today.  Sexually active, no dyspareunia.  No hot flashes, night sweats or vaginal dryness.   GYNECOLOGIC HISTORY: No LMP recorded. Patient has had an ablation. Contraception: Ablation Menopausal hormone therapy: None       OB History    Gravida  2   Para      Term      Preterm      AB  1   Living  1     SAB  1   TAB      Ectopic      Multiple      Live Births                 Patient Active Problem List   Diagnosis Date Noted  . Bacterial vaginosis 11/24/2019  . DUB (dysfunctional uterine bleeding) 09/09/2017  . Abnormal finding on mammography 04/19/2016  . Morbid obesity with BMI of 40.0-44.9, adult (HCC) 10/30/2015  . Closed nondisplaced fracture of lateral malleolus of right fibula 02/21/2015    Past Medical History:  Diagnosis Date  . Constipation   . Depression   . GERD (gastroesophageal reflux disease)    silent reflux  . Obesity     Past Surgical History:  Procedure Laterality Date  . CHOLECYSTECTOMY    . ENDOMETRIAL ABLATION    . ESOPHAGOGASTRODUODENOSCOPY (EGD) WITH PROPOFOL N/A 07/14/2014   Procedure: ESOPHAGOGASTRODUODENOSCOPY (EGD) WITH PROPOFOL;  Surgeon: Charna Elizabeth, MD;  Location: [facility];  Service: Endoscopy;  Laterality: N/A;  . GASTRIC BYPASS    . LAPAROSCOPIC GASTRIC SLEEVE RESECTION WITH HIATAL HERNIA REPAIR N/A 10/30/2015    Procedure: LAPAROSCOPIC GASTRIC SLEEVE RESECTION WITH REPAIR OF HIATAL HERNIA REPAIR;  Surgeon: Glenna Fellows, MD;  Location: [facility];  Service: General;  Laterality: N/A;  . TUBAL LIGATION    . UPPER GI ENDOSCOPY N/A 10/30/2015   Procedure: UPPER GI ENDOSCOPY;  Surgeon: Glenna Fellows, MD;  Location: [facility];  Service: General;  Laterality: N/A;    Current Outpatient Medications  Medication Sig Dispense Refill  . cholecalciferol (VITAMIN D) 1000 units tablet Take 1,000 Units by mouth daily.    . cyanocobalamin (,VITAMIN B-12,) 1000 MCG/ML injection Inject 1 mL into the skin every 30 (thirty) days.  1  . lamoTRIgine (LAMICTAL) 25 MG tablet Take 25 mg by mouth daily.    . Multiple Vitamin (MULTIVITAMIN WITH MINERALS) TABS tablet Take 1 tablet by mouth daily.     No current facility-administered medications for this visit.     ALLERGIES: Patient has no known allergies.  Family History  Problem Relation Age of Onset  . Hypertension Mother   . Heart attack Father   . Heart disease Father   . Diabetes Brother   . Hypertension Brother   . Heart disease Brother   . Heart attack Brother   .  Bone cancer Maternal Grandmother   . Stroke Maternal Grandfather     Social History   Socioeconomic History  . Marital status: Single    Spouse name: Not on file  . Number of children: Not on file  . Years of education: Not on file  . Highest education level: Not on file  Occupational History  . Not on file  Tobacco Use  . Smoking status: Never Smoker  . Smokeless tobacco: Never Used  Substance and Sexual Activity  . Alcohol use: Not Currently  . Drug use: No  . Sexual activity: Yes    Birth control/protection: Surgical    Comment: ablation & btl  Other Topics Concern  . Not on file  Social History Narrative  . Not on file   Social Determinants of Health   Financial Resource Strain:   . Difficulty of Paying Living Expenses: Not on file  Food Insecurity:   . Worried About  Charity fundraiser in the Last Year: Not on file  . Ran Out of Food in the Last Year: Not on file  Transportation Needs:   . Lack of Transportation (Medical): Not on file  . Lack of Transportation (Non-Medical): Not on file  Physical Activity:   . Days of Exercise per Week: Not on file  . Minutes of Exercise per Session: Not on file  Stress:   . Feeling of Stress : Not on file  Social Connections:   . Frequency of Communication with Friends and Family: Not on file  . Frequency of Social Gatherings with Friends and Family: Not on file  . Attends Religious Services: Not on file  . Active Member of Clubs or Organizations: Not on file  . Attends Archivist Meetings: Not on file  . Marital Status: Not on file  Intimate Partner Violence:   . Fear of Current or Ex-Partner: Not on file  . Emotionally Abused: Not on file  . Physically Abused: Not on file  . Sexually Abused: Not on file    Review of Systems  Constitutional: Negative.   HENT: Negative.   Eyes: Negative.   Respiratory: Negative.   Cardiovascular: Negative.   Gastrointestinal: Negative.   Genitourinary: Negative.   Musculoskeletal: Negative.   Skin: Negative.   Neurological: Negative.   Endo/Heme/Allergies: Negative.   Psychiatric/Behavioral: Negative.     PHYSICAL EXAMINATION:    BP 110/70 (BP Location: Right Arm, Patient Position: Sitting, Cuff Size: Normal)   Pulse 88   Temp (!) 97.3 F (36.3 C) (Skin)     General appearance: alert, cooperative and appears stated age  Ultrasound images were reviewed with the patient. Thin, irregular endometrium (c/w ablation), suspected adenomyosis, small simple right ovarian cyst.   ASSESSMENT 1 month of daily spotting with odor, 20 years s/p ablation. She has stopped bleeding, odor has stopped with end of the bleeding. She is on treatment for BV. Ultrasound is without concerning findings.     PLAN FSH, TSH, CBC Will treat with doxy for possible endometritis  (given the odor) Any further bleeding and I would recommend she have an endometrial biopsy She will complete the flagyl Discussed that endometrial biopsy can be difficult secondary to uterine scaring and may not be adequate.    An After Visit Summary was printed and given to the patient.  ~15 minutes face to face time of which over 50% was spent in counseling.   CC: Evalee Mutton, CNM

## 2019-12-01 ENCOUNTER — Telehealth: Payer: Self-pay | Admitting: Certified Nurse Midwife

## 2019-12-01 LAB — CBC
Hematocrit: 38.6 % (ref 34.0–46.6)
Hemoglobin: 13.1 g/dL (ref 11.1–15.9)
MCH: 32.5 pg (ref 26.6–33.0)
MCHC: 33.9 g/dL (ref 31.5–35.7)
MCV: 96 fL (ref 79–97)
Platelets: 239 10*3/uL (ref 150–450)
RBC: 4.03 x10E6/uL (ref 3.77–5.28)
RDW: 12 % (ref 11.7–15.4)
WBC: 6.5 10*3/uL (ref 3.4–10.8)

## 2019-12-01 LAB — TSH: TSH: 0.993 u[IU]/mL (ref 0.450–4.500)

## 2019-12-01 LAB — FOLLICLE STIMULATING HORMONE: FSH: 26.6 m[IU]/mL

## 2019-12-01 NOTE — Telephone Encounter (Signed)
Spoke to pt. Given instructions per Debbi, CNM advice for bath and medications. Pt agreeable. Will call back if symptoms are still present after finishing abx for BV and Doxy.  Routing to provider for final review. Patient is agreeable to disposition. Will close encounter.

## 2019-12-01 NOTE — Telephone Encounter (Signed)
Patient may have some burning with urine touches the skin with BV infection. She can do sitz baths with baking soda or Aveeno Oatmeal bath,which will relieve the discomfort. Increase her water intake, no sodas. Doxycyline is an antibiotic and will work for infection if present. I suspect she is having discomfort from the [place] being treated also. She needs to call if symptoms to not decrease.

## 2019-12-01 NOTE — Telephone Encounter (Signed)
Left message for pt to call back to triage Colletta Maryland, RN

## 2019-12-01 NOTE — Telephone Encounter (Signed)
Patient was in for ultrasound yesterday and has been experiencing vaginal pain and burning after appointment.

## 2019-12-01 NOTE — Telephone Encounter (Signed)
Pt returned call. Pt states having burning, pressure, vaginal pain and frequency since having PUS yesterday. Pt is currently taking Flagyl for BV until Friday and started on Doxycycline yesterday after PUS appt. Pt wants to know if these meds will take care of it if UTI? And what else does she need to do?   Will route to Dr Talbert Nan and Johny Shock, CNM for advice and recommendations.

## 2020-01-12 ENCOUNTER — Ambulatory Visit: Payer: Managed Care, Other (non HMO)

## 2020-01-19 ENCOUNTER — Other Ambulatory Visit: Payer: Self-pay

## 2020-01-19 ENCOUNTER — Ambulatory Visit
Admission: RE | Admit: 2020-01-19 | Discharge: 2020-01-19 | Disposition: A | Discharge status: Home / Self Care | Payer: Commercial Managed Care - PPO | Source: Ambulatory Visit | Attending: Obstetrics and Gynecology | Admitting: Obstetrics and Gynecology

## 2020-01-19 DIAGNOSIS — Z1231 Encounter for screening mammogram for malignant neoplasm of breast: Secondary | ICD-10-CM

## 2020-03-06 ENCOUNTER — Encounter: Payer: Self-pay | Admitting: Certified Nurse Midwife

## 2020-03-10 ENCOUNTER — Telehealth: Payer: Self-pay | Admitting: Obstetrics and Gynecology

## 2020-03-10 DIAGNOSIS — N939 Abnormal uterine and vaginal bleeding, unspecified: Secondary | ICD-10-CM

## 2020-03-10 NOTE — Telephone Encounter (Signed)
Spoke to pt. Pt states started spotting again on Wed 03/08/2020 and was told to call and next step would be EMB. Pt agreeable. Pt scheduled per pt's request on 03/30/2020 at 4pm. 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 Orders placed. Pt was last seen for AUB on 11/30/2019 with JJ and was recommended for EMB if any further bleeding. Next AEX scheduled for 11/29/2020.  Routing to Dr Oscar La for review and will close encounter.  Cc: Hayley and Rosa for precert.

## 2020-03-10 NOTE — Telephone Encounter (Signed)
Patient was told to call for a biopsy if she has more bleeding.

## 2020-03-16 ENCOUNTER — Telehealth: Payer: Self-pay | Admitting: Obstetrics and Gynecology

## 2020-03-16 NOTE — Telephone Encounter (Signed)
Patient returned my call and I conveyed the benefits. Patient understands/agreeable with the benefits. Patient is aware of the cancellation policy. Appointment scheduled 03/30/20. 

## 2020-03-16 NOTE — Telephone Encounter (Signed)
Call placed to convey benefits for endometrial biopsy. 

## 2020-03-29 ENCOUNTER — Other Ambulatory Visit: Payer: Self-pay

## 2020-03-30 ENCOUNTER — Encounter: Payer: Self-pay | Admitting: Obstetrics and Gynecology

## 2020-03-30 ENCOUNTER — Ambulatory Visit (INDEPENDENT_AMBULATORY_CARE_PROVIDER_SITE_OTHER): Payer: Commercial Managed Care - PPO | Admitting: Obstetrics and Gynecology

## 2020-03-30 VITALS — BP 112/70 | HR 76 | Temp 97.7°F | Resp 10 | Ht 62.0 in

## 2020-03-30 DIAGNOSIS — B9689 Other specified bacterial agents as the cause of diseases classified elsewhere: Secondary | ICD-10-CM

## 2020-03-30 DIAGNOSIS — N939 Abnormal uterine and vaginal bleeding, unspecified: Secondary | ICD-10-CM

## 2020-03-30 DIAGNOSIS — N760 Acute vaginitis: Secondary | ICD-10-CM

## 2020-03-30 DIAGNOSIS — N882 Stricture and stenosis of cervix uteri: Secondary | ICD-10-CM

## 2020-03-30 DIAGNOSIS — Z113 Encounter for screening for infections with a predominantly sexual mode of transmission: Secondary | ICD-10-CM

## 2020-03-30 DIAGNOSIS — Z9889 Other specified postprocedural states: Secondary | ICD-10-CM | POA: Diagnosis not present

## 2020-03-30 MED ORDER — METRONIDAZOLE 500 MG PO TABS
500.0000 mg | ORAL_TABLET | Freq: Two times a day (BID) | ORAL | 0 refills | Status: DC
Start: 2020-03-30 — End: 2020-04-27

## 2020-03-30 NOTE — Patient Instructions (Signed)

## 2020-03-30 NOTE — Progress Notes (Signed)
GYNECOLOGY  VISIT   HPI: 49 y.o.   Single White or Caucasian Not Hispanic or Latino  female   [id number] with Patient's last menstrual period was 03/29/2020.   here for EMB    The patient has a h/o an endometrial ablation, she didn't have any bleeding for about 20 years. At the end of last year she spotted daily x 4 weeks with an associated odor. She was treated by Ms Hollice Espy for BV and presented on 11/30/19 for an exam and ultrasound. She had a thin irregular stripe (c/w ablation), suspected adenomyosis and a small simple right ovarian cyst. She was noted to have uterine tenderness and was treated for a possible endometritis. She was instructed to call with any further bleeding. [place] wa 26.6, TSH normal She noticed slight red blood the end of March and again this week. She notices a slight increase in white vaginal d/c with odor. No dyspareunia.  GYNECOLOGIC HISTORY: Patient's last menstrual period was 03/29/2020. Contraception:tubal ligation Menopausal hormone therapy: none        OB History    Gravida  2   Para      Term      Preterm      AB  1   Living  1     SAB  1   TAB      Ectopic      Multiple      Live Births                 Patient Active Problem List   Diagnosis Date Noted  . Bacterial vaginosis 11/24/2019  . DUB (dysfunctional uterine bleeding) 09/09/2017  . Abnormal finding on mammography 04/19/2016  . Morbid obesity with BMI of 40.0-44.9, adult ([facility]) 10/30/2015  . Closed nondisplaced fracture of lateral malleolus of right fibula 02/21/2015    Past Medical History:  Diagnosis Date  . Constipation   . Depression   . GERD (gastroesophageal reflux disease)    silent reflux  . Obesity     Past Surgical History:  Procedure Laterality Date  . AUGMENTATION MAMMAPLASTY     2019 and 2020  . CHOLECYSTECTOMY    . ENDOMETRIAL ABLATION    . ESOPHAGOGASTRODUODENOSCOPY (EGD) WITH PROPOFOL N/A 07/14/2014   Procedure: ESOPHAGOGASTRODUODENOSCOPY (EGD) WITH  PROPOFOL;  Surgeon: Juanita Craver, MD;  Location: [facility];  Service: Endoscopy;  Laterality: N/A;  . GASTRIC BYPASS    . LAPAROSCOPIC GASTRIC SLEEVE RESECTION WITH HIATAL HERNIA REPAIR N/A 10/30/2015   Procedure: LAPAROSCOPIC GASTRIC SLEEVE RESECTION WITH REPAIR OF HIATAL HERNIA REPAIR;  Surgeon: Excell Seltzer, MD;  Location: [facility];  Service: General;  Laterality: N/A;  . TUBAL LIGATION    . UPPER GI ENDOSCOPY N/A 10/30/2015   Procedure: UPPER GI ENDOSCOPY;  Surgeon: Excell Seltzer, MD;  Location: [facility];  Service: General;  Laterality: N/A;    Current Outpatient Medications  Medication Sig Dispense Refill  . cholecalciferol (VITAMIN D) 1000 units tablet Take 1,000 Units by mouth daily.    . cyanocobalamin (,VITAMIN B-12,) 1000 MCG/ML injection Inject 1 mL into the skin every 30 (thirty) days.  1  . lamoTRIgine (LAMICTAL) 25 MG tablet Take 25 mg by mouth daily.    . Multiple Vitamin (MULTIVITAMIN WITH MINERALS) TABS tablet Take 1 tablet by mouth daily.     No current facility-administered medications for this visit.     ALLERGIES: Patient has no known allergies.  Family History  Problem Relation Age of Onset  . Hypertension Mother   .  Heart attack Father   . Heart disease Father   . Diabetes Brother   . Hypertension Brother   . Heart disease Brother   . Heart attack Brother   . Bone cancer Maternal Grandmother   . Stroke Maternal Grandfather     Social History   Socioeconomic History  . Marital status: Single    Spouse name: Not on file  . Number of children: Not on file  . Years of education: Not on file  . Highest education level: Not on file  Occupational History  . Not on file  Tobacco Use  . Smoking status: Never Smoker  . Smokeless tobacco: Never Used  Substance and Sexual Activity  . Alcohol use: Not Currently  . Drug use: No  . Sexual activity: Yes    Birth control/protection: Surgical    Comment: ablation & btl  Other Topics Concern  . Not on  file  Social History Narrative  . Not on file   Social Determinants of Health   Financial Resource Strain:   . Difficulty of Paying Living Expenses:   Food Insecurity:   . Worried About Programme researcher, broadcasting/film/video in the Last Year:   . Barista in the Last Year:   Transportation Needs:   . Freight forwarder (Medical):   Marland Kitchen Lack of Transportation (Non-Medical):   Physical Activity:   . Days of Exercise per Week:   . Minutes of Exercise per Session:   Stress:   . Feeling of Stress :   Social Connections:   . Frequency of Communication with Friends and Family:   . Frequency of Social Gatherings with Friends and Family:   . Attends Religious Services:   . Active Member of Clubs or Organizations:   . Attends Banker Meetings:   Marland Kitchen Marital Status:   Intimate Partner Violence:   . Fear of Current or Ex-Partner:   . Emotionally Abused:   Marland Kitchen Physically Abused:   . Sexually Abused:     Review of Systems  Constitutional: Negative.   HENT: Negative.   Eyes: Negative.   Respiratory: Negative.   Cardiovascular: Negative.   Gastrointestinal: Negative.   Genitourinary:       Vaginal bleeding   Musculoskeletal: Negative.   Skin: Negative.   Neurological: Negative.   Endo/Heme/Allergies: Negative.   Psychiatric/Behavioral: Negative.     PHYSICAL EXAMINATION:    BP 112/70 (BP Location: Left Arm, Patient Position: Sitting, Cuff Size: Normal)   Pulse 76   Temp 97.7 F (36.5 C) (Temporal)   Resp 10   Ht 5\' 2"  (1.575 m)   LMP 03/29/2020 Comment: spotting  BMI 27.62 kg/m     General appearance: alert, cooperative and appears stated age Neck: no adenopathy, supple, symmetrical, trachea midline and thyroid  Heart: regular rate and rhythm Lungs: CTAB Abdomen: soft, non-tender; bowel sounds normal; no masses,  no organomegaly Extremities: normal, atraumatic, no cyanosis Skin: normal color, texture and turgor, no rashes or lesions Lymph: normal cervical  supraclavicular and inguinal nodes Neurologic: grossly normal   Pelvic: External genitalia:  no lesions              Urethra:  normal appearing urethra with no masses, tenderness or lesions              Bartholins and Skenes: normal                 Vagina: mildly atrophic appearing vagina with normal color and discharge, no  lesions              Cervix: no lesions                The risks of endometrial biopsy were reviewed and a consent was obtained.  A speculum was placed in the vagina and the cervix was cleansed with betadine. A tenaculum was placed on the cervix. I was unable to pass the mini-pipelle into the uterine cavity. I attempted to dilate her cervix with the mini-dilators, unable to get passed in internal os/LUS.  The tenaculum and speculum were removed. There were no complications.   Chaperone was present for exam.  Wet prep: ++ clue, no trich, rare wbc KOH: no yeast PH: 5   ASSESSMENT Abnormal uterine bleeding H/O endometrial ablation Unable to obtain endometrial biopsy Bacterial vaginitis     PLAN Discussed option of attempting endometrial biopsy in the office with ultrasound guidance or in the OR. She prefers further evaluation in the OR. Will plan hysteroscopy, D&C in the OR with ultrasound guidance. I reviewed that I still may not be able to get into her cavity secondary to scarring from the ablation. Will pretreat with cytotec Discussed risks of bleeding, infection, uterine perforation and need for further surgery. Send nuswab for STD testing Treat BV   ACOG handouts on Hysteroscopy, D&C were given  In addition to the endometrial biopsy, over 20 minutes was spent in patient care, including management of her BV and discussing management options for her abnormal uterine bleeding

## 2020-04-03 ENCOUNTER — Telehealth: Payer: Self-pay | Admitting: Obstetrics and Gynecology

## 2020-04-03 ENCOUNTER — Other Ambulatory Visit (HOSPITAL_COMMUNITY): Payer: Self-pay | Admitting: Obstetrics and Gynecology

## 2020-04-03 ENCOUNTER — Other Ambulatory Visit: Payer: Self-pay | Admitting: Obstetrics and Gynecology

## 2020-04-03 DIAGNOSIS — N939 Abnormal uterine and vaginal bleeding, unspecified: Secondary | ICD-10-CM

## 2020-04-03 LAB — CHLAMYDIA/GONOCOCCUS/TRICHOMONAS, NAA
Chlamydia by NAA: NEGATIVE
Gonococcus by NAA: NEGATIVE
Trich vag by NAA: NEGATIVE

## 2020-04-03 NOTE — Telephone Encounter (Signed)
Patient returned a call to Hayley. 

## 2020-04-03 NOTE — Telephone Encounter (Signed)
Spoke with patient. Discussed surgery dates. Patient will discuss with employer and return call.

## 2020-04-03 NOTE — Telephone Encounter (Signed)
Return call to patient. Patient is ready to proceed with scheduling. Call transferred to [facility] for scheduling.

## 2020-04-03 NOTE — Telephone Encounter (Addendum)
Spoke with patient. Patient would like to proceed with surgery on 05/09/2020. Surgery scheduled for 05/09/2020 at [facility]. Pre op on 04/18/2020 at 4:30 pm with Dr.Jertson. COVID test 05/05/2020 at 3 pm at [facility] location. Patient is aware of the need to quarantine after test until surgery. 2 week post op 05/18/2020 at 4 pm. Patient is agreeable to all dates and times. Surgery instructions reviewed and form to Terra's desk to provide patient at pre op appointment.  Routing to provider and will close encounter.

## 2020-04-03 NOTE — Telephone Encounter (Signed)
Spoke with patient regarding surgery benefits. Patient acknowledges understanding of information presented. Patient is aware that benefits presented are for professional benefits only. Patient is aware that once surgery is scheduled, the hospital will call with separate benefits. Patient is aware of surgery cancellation policy.  Patient to call back this afternoon to proceed with scheduling.

## 2020-04-15 ENCOUNTER — Encounter: Payer: Self-pay | Admitting: Obstetrics and Gynecology

## 2020-04-17 ENCOUNTER — Telehealth: Payer: Self-pay

## 2020-04-17 NOTE — Telephone Encounter (Signed)
Denise Summers, [facility]  Phone Number: [phone]  Saturday .Marland Kitchen I went to bathroom .. and wiping .. and it was like very brown blood !

## 2020-04-17 NOTE — Telephone Encounter (Signed)
Thanks for letting me know. Will proceed with hysteroscopy under ultrasound guidance later this month.

## 2020-04-17 NOTE — Telephone Encounter (Signed)
AEX 11/2019 with DL  AUB OV 0/62/37 with JJ +BV 12/20, treated Neg GC/CHL/trich on 03/30/20 D&C with hysteroscopy scheduled for 05/09/20.  Spoke with pt. Pt states first noticing  " dark brown blood" when wiping on Saturday with "clumps" that is still continuing today.   Pt denies changing pad, panty liner. Only seeing this brown blood when wiping and going to void.  Pt denies any fever, chills, heavy bleeding, large clots, discharge, odor or cramps. Denies weakness or dizziness.  Pt given heavy bleeding precautions. Pt verbalized understanding.  Pt just wanted to make Dr Oscar La aware. Will review with provider and return call to pt with any additional recommendations. Pt agreeable.   Routing to Dr Oscar La.

## 2020-04-17 NOTE — Telephone Encounter (Signed)
Left detailed message per DPR with Dr Salli Quarry recommendations of monitoring the bleeding until hysteroscopy on 05/09/20. Heavy bleeding precautions given. Pt to return call with any questions or concerns.   Encounter closed.

## 2020-04-18 ENCOUNTER — Ambulatory Visit: Payer: Commercial Managed Care - PPO | Admitting: Obstetrics and Gynecology

## 2020-04-27 ENCOUNTER — Other Ambulatory Visit: Payer: Self-pay

## 2020-04-27 ENCOUNTER — Encounter: Payer: Self-pay | Admitting: Obstetrics and Gynecology

## 2020-04-27 ENCOUNTER — Ambulatory Visit (INDEPENDENT_AMBULATORY_CARE_PROVIDER_SITE_OTHER): Payer: Commercial Managed Care - PPO | Admitting: Obstetrics and Gynecology

## 2020-04-27 VITALS — BP 100/70 | HR 57 | Temp 97.3°F | Ht 62.0 in | Wt 148.0 lb

## 2020-04-27 DIAGNOSIS — N882 Stricture and stenosis of cervix uteri: Secondary | ICD-10-CM

## 2020-04-27 DIAGNOSIS — N939 Abnormal uterine and vaginal bleeding, unspecified: Secondary | ICD-10-CM

## 2020-04-27 DIAGNOSIS — Z9889 Other specified postprocedural states: Secondary | ICD-10-CM

## 2020-04-27 MED ORDER — MISOPROSTOL 200 MCG PO TABS: ORAL_TABLET | ORAL | 0 refills | Status: DC

## 2020-04-27 NOTE — Progress Notes (Signed)
GYNECOLOGY  VISIT   HPI: 49 y.o.   Single White or Caucasian Not Hispanic or Latino  female   [id number] with Patient's last menstrual period was 03/29/2020.   here for a preoperative visit. The patient has a history of an endometrial ablation and has been having abnormal uterine bleeding since the end of last year. This started after 20 years of amenorrhea. Unable to get into her cavity to perform a biopsy. She had an ultrasound in 12/20 when she initially presented with the bleeding. The ultrasound showed a thin, irregular endometrium (c/w ablation) and suspected adenomyosis.   GYNECOLOGIC HISTORY: Patient's last menstrual period was 03/29/2020. Contraception:tubal ligation Menopausal hormone therapy: none        OB History    Gravida  2   Para      Term      Preterm      AB  1   Living  1     SAB  1   TAB      Ectopic      Multiple      Live Births                 Patient Active Problem List   Diagnosis Date Noted  . Bacterial vaginosis 11/24/2019  . DUB (dysfunctional uterine bleeding) 09/09/2017  . Abnormal finding on mammography 04/19/2016  . Morbid obesity with BMI of 40.0-44.9, adult ([facility]) 10/30/2015  . Closed nondisplaced fracture of lateral malleolus of right fibula 02/21/2015    Past Medical History:  Diagnosis Date  . Constipation   . Depression   . GERD (gastroesophageal reflux disease)    silent reflux  . Obesity     Past Surgical History:  Procedure Laterality Date  . AUGMENTATION MAMMAPLASTY     2019 and 2020  . CHOLECYSTECTOMY    . ENDOMETRIAL ABLATION    . ESOPHAGOGASTRODUODENOSCOPY (EGD) WITH PROPOFOL N/A 07/14/2014   Procedure: ESOPHAGOGASTRODUODENOSCOPY (EGD) WITH PROPOFOL;  Surgeon: Charna Elizabeth, MD;  Location: [facility];  Service: Endoscopy;  Laterality: N/A;  . GASTRIC BYPASS    . LAPAROSCOPIC GASTRIC SLEEVE RESECTION WITH HIATAL HERNIA REPAIR N/A 10/30/2015   Procedure: LAPAROSCOPIC GASTRIC SLEEVE RESECTION WITH REPAIR OF  HIATAL HERNIA REPAIR;  Surgeon: Glenna Fellows, MD;  Location: [facility];  Service: General;  Laterality: N/A;  . TUBAL LIGATION    . UPPER GI ENDOSCOPY N/A 10/30/2015   Procedure: UPPER GI ENDOSCOPY;  Surgeon: Glenna Fellows, MD;  Location: [facility];  Service: General;  Laterality: N/A;    Current Outpatient Medications  Medication Sig Dispense Refill  . cholecalciferol (VITAMIN D) 1000 units tablet Take 1,000 Units by mouth daily.    . cyanocobalamin (,VITAMIN B-12,) 1000 MCG/ML injection Inject 1 mL into the skin every 30 (thirty) days.  1  . lamoTRIgine (LAMICTAL) 25 MG tablet Take 25 mg by mouth daily.    . Multiple Vitamin (MULTIVITAMIN WITH MINERALS) TABS tablet Take 1 tablet by mouth daily.     No current facility-administered medications for this visit.     ALLERGIES: Patient has no known allergies.  Family History  Problem Relation Age of Onset  . Hypertension Mother   . Heart attack Father   . Heart disease Father   . Diabetes Brother   . Hypertension Brother   . Heart disease Brother   . Heart attack Brother   . Bone cancer Maternal Grandmother   . Stroke Maternal Grandfather     Social History   Socioeconomic History  .  Marital status: Single    Spouse name: Not on file  . Number of children: Not on file  . Years of education: Not on file  . Highest education level: Not on file  Occupational History  . Not on file  Tobacco Use  . Smoking status: Never Smoker  . Smokeless tobacco: Never Used  Substance and Sexual Activity  . Alcohol use: Not Currently  . Drug use: No  . Sexual activity: Yes    Birth control/protection: Surgical    Comment: ablation & btl  Other Topics Concern  . Not on file  Social History Narrative  . Not on file   Social Determinants of Health   Financial Resource Strain:   . Difficulty of Paying Living Expenses:   Food Insecurity:   . Worried About Charity fundraiser in the Last Year:   . Arboriculturist in the Last Year:    Transportation Needs:   . Film/video editor (Medical):   Marland Kitchen Lack of Transportation (Non-Medical):   Physical Activity:   . Days of Exercise per Week:   . Minutes of Exercise per Session:   Stress:   . Feeling of Stress :   Social Connections:   . Frequency of Communication with Friends and Family:   . Frequency of Social Gatherings with Friends and Family:   . Attends Religious Services:   . Active Member of Clubs or Organizations:   . Attends Archivist Meetings:   Marland Kitchen Marital Status:   Intimate Partner Violence:   . Fear of Current or Ex-Partner:   . Emotionally Abused:   Marland Kitchen Physically Abused:   . Sexually Abused:     Review of Systems  Constitutional: Negative.   HENT: Negative.   Eyes: Negative.   Respiratory: Negative.   Cardiovascular: Negative.   Gastrointestinal: Negative.   Genitourinary: Negative.   Musculoskeletal: Negative.   Skin: Negative.   Neurological: Negative.   Endo/Heme/Allergies: Negative.   Psychiatric/Behavioral: Negative.     PHYSICAL EXAMINATION:    BP 100/70 (BP Location: Right Arm, Patient Position: Sitting, Cuff Size: Normal)   Pulse (!) 57   Temp (!) 97.3 F (36.3 C) (Temporal)   Ht 5\' 2"  (1.575 m)   Wt 148 lb (67.1 kg)   LMP 03/29/2020 Comment: spotting  SpO2 100%   BMI 27.07 kg/m     General appearance: alert, cooperative and appears stated age Neck: no adenopathy, supple, symmetrical, trachea midline and thyroid normal to inspection and palpation Heart: regular rate and rhythm Lungs: CTAB Abdomen: soft, non-tender; bowel sounds normal; no masses,  no organomegaly Extremities: normal, atraumatic, no cyanosis Skin: normal color, texture and turgor, no rashes or lesions Lymph: normal cervical supraclavicular and inguinal nodes Neurologic: grossly normal   ASSESSMENT H/O endometrial ablation AUB Unable to perform an endometrial biopsy in the office.     PLAN Hysteroscopy, D&C under ultrasound guidance.   Pre-treat with cytotec. Risk reviewed, including the risk of bleeding, infection, uterine perforation, and need for further surgery.

## 2020-04-27 NOTE — H&P (View-Only) (Signed)
GYNECOLOGY  VISIT   HPI: 49 y.o.   Single White or Caucasian Not Hispanic or Latino  female   [id number] with Patient's last menstrual period was 03/29/2020.   here for a preoperative visit. The patient has a history of an endometrial ablation and has been having abnormal uterine bleeding since the end of last year. This started after 20 years of amenorrhea. Unable to get into her cavity to perform a biopsy. She had an ultrasound in 12/20 when she initially presented with the bleeding. The ultrasound showed a thin, irregular endometrium (c/w ablation) and suspected adenomyosis.   GYNECOLOGIC HISTORY: Patient's last menstrual period was 03/29/2020. Contraception:tubal ligation Menopausal hormone therapy: none        OB History    Gravida  2   Para      Term      Preterm      AB  1   Living  1     SAB  1   TAB      Ectopic      Multiple      Live Births                 Patient Active Problem List   Diagnosis Date Noted  . Bacterial vaginosis 11/24/2019  . DUB (dysfunctional uterine bleeding) 09/09/2017  . Abnormal finding on mammography 04/19/2016  . Morbid obesity with BMI of 40.0-44.9, adult ([facility]) 10/30/2015  . Closed nondisplaced fracture of lateral malleolus of right fibula 02/21/2015    Past Medical History:  Diagnosis Date  . Constipation   . Depression   . GERD (gastroesophageal reflux disease)    silent reflux  . Obesity     Past Surgical History:  Procedure Laterality Date  . AUGMENTATION MAMMAPLASTY     2019 and 2020  . CHOLECYSTECTOMY    . ENDOMETRIAL ABLATION    . ESOPHAGOGASTRODUODENOSCOPY (EGD) WITH PROPOFOL N/A 07/14/2014   Procedure: ESOPHAGOGASTRODUODENOSCOPY (EGD) WITH PROPOFOL;  Surgeon: Charna Elizabeth, MD;  Location: [facility];  Service: Endoscopy;  Laterality: N/A;  . GASTRIC BYPASS    . LAPAROSCOPIC GASTRIC SLEEVE RESECTION WITH HIATAL HERNIA REPAIR N/A 10/30/2015   Procedure: LAPAROSCOPIC GASTRIC SLEEVE RESECTION WITH REPAIR OF  HIATAL HERNIA REPAIR;  Surgeon: Glenna Fellows, MD;  Location: [facility];  Service: General;  Laterality: N/A;  . TUBAL LIGATION    . UPPER GI ENDOSCOPY N/A 10/30/2015   Procedure: UPPER GI ENDOSCOPY;  Surgeon: Glenna Fellows, MD;  Location: [facility];  Service: General;  Laterality: N/A;    Current Outpatient Medications  Medication Sig Dispense Refill  . cholecalciferol (VITAMIN D) 1000 units tablet Take 1,000 Units by mouth daily.    . cyanocobalamin (,VITAMIN B-12,) 1000 MCG/ML injection Inject 1 mL into the skin every 30 (thirty) days.  1  . lamoTRIgine (LAMICTAL) 25 MG tablet Take 25 mg by mouth daily.    . Multiple Vitamin (MULTIVITAMIN WITH MINERALS) TABS tablet Take 1 tablet by mouth daily.     No current facility-administered medications for this visit.     ALLERGIES: Patient has no known allergies.  Family History  Problem Relation Age of Onset  . Hypertension Mother   . Heart attack Father   . Heart disease Father   . Diabetes Brother   . Hypertension Brother   . Heart disease Brother   . Heart attack Brother   . Bone cancer Maternal Grandmother   . Stroke Maternal Grandfather     Social History   Socioeconomic History  .  Marital status: Single    Spouse name: Not on file  . Number of children: Not on file  . Years of education: Not on file  . Highest education level: Not on file  Occupational History  . Not on file  Tobacco Use  . Smoking status: Never Smoker  . Smokeless tobacco: Never Used  Substance and Sexual Activity  . Alcohol use: Not Currently  . Drug use: No  . Sexual activity: Yes    Birth control/protection: Surgical    Comment: ablation & btl  Other Topics Concern  . Not on file  Social History Narrative  . Not on file   Social Determinants of Health   Financial Resource Strain:   . Difficulty of Paying Living Expenses:   Food Insecurity:   . Worried About Running Out of Food in the Last Year:   . Ran Out of Food in the Last Year:    Transportation Needs:   . Lack of Transportation (Medical):   . Lack of Transportation (Non-Medical):   Physical Activity:   . Days of Exercise per Week:   . Minutes of Exercise per Session:   Stress:   . Feeling of Stress :   Social Connections:   . Frequency of Communication with Friends and Family:   . Frequency of Social Gatherings with Friends and Family:   . Attends Religious Services:   . Active Member of Clubs or Organizations:   . Attends Club or Organization Meetings:   . Marital Status:   Intimate Partner Violence:   . Fear of Current or Ex-Partner:   . Emotionally Abused:   . Physically Abused:   . Sexually Abused:     Review of Systems  Constitutional: Negative.   HENT: Negative.   Eyes: Negative.   Respiratory: Negative.   Cardiovascular: Negative.   Gastrointestinal: Negative.   Genitourinary: Negative.   Musculoskeletal: Negative.   Skin: Negative.   Neurological: Negative.   Endo/Heme/Allergies: Negative.   Psychiatric/Behavioral: Negative.     PHYSICAL EXAMINATION:    BP 100/70 (BP Location: Right Arm, Patient Position: Sitting, Cuff Size: Normal)   Pulse (!) 57   Temp (!) 97.3 F (36.3 C) (Temporal)   Ht 5' 2" (1.575 m)   Wt 148 lb (67.1 kg)   LMP 03/29/2020 Comment: spotting  SpO2 100%   BMI 27.07 kg/m     General appearance: alert, cooperative and appears stated age Neck: no adenopathy, supple, symmetrical, trachea midline and thyroid normal to inspection and palpation Heart: regular rate and rhythm Lungs: CTAB Abdomen: soft, non-tender; bowel sounds normal; no masses,  no organomegaly Extremities: normal, atraumatic, no cyanosis Skin: normal color, texture and turgor, no rashes or lesions Lymph: normal cervical supraclavicular and inguinal nodes Neurologic: grossly normal   ASSESSMENT H/O endometrial ablation AUB Unable to perform an endometrial biopsy in the office.     PLAN Hysteroscopy, D&C under ultrasound guidance.   Pre-treat with cytotec. Risk reviewed, including the risk of bleeding, infection, uterine perforation, and need for further surgery.      

## 2020-05-02 ENCOUNTER — Encounter (HOSPITAL_BASED_OUTPATIENT_CLINIC_OR_DEPARTMENT_OTHER): Payer: Self-pay | Admitting: Obstetrics and Gynecology

## 2020-05-02 ENCOUNTER — Other Ambulatory Visit: Payer: Self-pay

## 2020-05-02 NOTE — Progress Notes (Signed)
Spoke w/ via phone for pre-op interview---patient Lab needs dos----   Urine poct          COVID test ------05-06-2020 at 1225 pm Arrive at -------900 am 05-09-2020 NPO after ------midnight Medications to take morning of surgery -----lamictal Diabetic medication -----n/a Patient Special Instructions -----none Pre-Op special Istructions -----none Patient verbalized understanding of instructions that were given at this phone interview. Patient denies shortness of breath, chest pain, fever, cough a this phone interview.

## 2020-05-05 ENCOUNTER — Other Ambulatory Visit (HOSPITAL_COMMUNITY): Payer: Self-pay

## 2020-05-05 ENCOUNTER — Other Ambulatory Visit (HOSPITAL_COMMUNITY)
Admission: RE | Admit: 2020-05-05 | Discharge: 2020-05-05 | Disposition: A | Discharge status: Home / Self Care | Payer: Commercial Managed Care - PPO | Source: Ambulatory Visit | Attending: Obstetrics and Gynecology | Admitting: Obstetrics and Gynecology

## 2020-05-05 DIAGNOSIS — Z20822 Contact with and (suspected) exposure to covid-19: Secondary | ICD-10-CM | POA: Diagnosis not present

## 2020-05-05 DIAGNOSIS — Z01812 Encounter for preprocedural laboratory examination: Secondary | ICD-10-CM | POA: Diagnosis present

## 2020-05-05 LAB — SARS CORONAVIRUS 2 (TAT 6-24 HRS): SARS Coronavirus 2: NEGATIVE

## 2020-05-06 ENCOUNTER — Other Ambulatory Visit (HOSPITAL_COMMUNITY): Payer: Self-pay

## 2020-05-09 ENCOUNTER — Other Ambulatory Visit: Payer: Self-pay

## 2020-05-09 ENCOUNTER — Encounter (HOSPITAL_BASED_OUTPATIENT_CLINIC_OR_DEPARTMENT_OTHER)
Admission: RE | Disposition: A | Discharge status: Home / Self Care | Payer: Self-pay | Source: Home / Self Care | Attending: Obstetrics and Gynecology

## 2020-05-09 ENCOUNTER — Ambulatory Visit (HOSPITAL_BASED_OUTPATIENT_CLINIC_OR_DEPARTMENT_OTHER): Payer: Commercial Managed Care - PPO | Admitting: Anesthesiology

## 2020-05-09 ENCOUNTER — Ambulatory Visit (HOSPITAL_COMMUNITY)
Admission: RE | Admit: 2020-05-09 | Discharge: 2020-05-09 | Disposition: A | Discharge status: Home / Self Care | Payer: Commercial Managed Care - PPO | Source: Ambulatory Visit | Attending: Obstetrics and Gynecology | Admitting: Obstetrics and Gynecology

## 2020-05-09 ENCOUNTER — Ambulatory Visit (HOSPITAL_BASED_OUTPATIENT_CLINIC_OR_DEPARTMENT_OTHER)
Admission: RE | Admit: 2020-05-09 | Discharge: 2020-05-09 | Disposition: A | Discharge status: Home / Self Care | Payer: Commercial Managed Care - PPO | Attending: Obstetrics and Gynecology | Admitting: Obstetrics and Gynecology

## 2020-05-09 ENCOUNTER — Encounter (HOSPITAL_BASED_OUTPATIENT_CLINIC_OR_DEPARTMENT_OTHER): Payer: Self-pay | Admitting: Obstetrics and Gynecology

## 2020-05-09 DIAGNOSIS — N939 Abnormal uterine and vaginal bleeding, unspecified: Secondary | ICD-10-CM

## 2020-05-09 DIAGNOSIS — Z6827 Body mass index (BMI) 27.0-27.9, adult: Secondary | ICD-10-CM | POA: Diagnosis not present

## 2020-05-09 DIAGNOSIS — N882 Stricture and stenosis of cervix uteri: Secondary | ICD-10-CM

## 2020-05-09 DIAGNOSIS — E669 Obesity, unspecified: Secondary | ICD-10-CM | POA: Insufficient documentation

## 2020-05-09 DIAGNOSIS — Z9889 Other specified postprocedural states: Secondary | ICD-10-CM | POA: Diagnosis not present

## 2020-05-09 DIAGNOSIS — Z9884 Bariatric surgery status: Secondary | ICD-10-CM | POA: Insufficient documentation

## 2020-05-09 DIAGNOSIS — Z79899 Other long term (current) drug therapy: Secondary | ICD-10-CM | POA: Insufficient documentation

## 2020-05-09 HISTORY — DX: Other complications of anesthesia, initial encounter: T88.59XA

## 2020-05-09 HISTORY — PX: OPERATIVE ULTRASOUND: SHX5996

## 2020-05-09 HISTORY — DX: Abnormal uterine and vaginal bleeding, unspecified: N93.9

## 2020-05-09 HISTORY — PX: DILATATION & CURETTAGE/HYSTEROSCOPY WITH MYOSURE: SHX6511

## 2020-05-09 LAB — POCT PREGNANCY, URINE: Preg Test, Ur: NEGATIVE

## 2020-05-09 SURGERY — DILATATION & CURETTAGE/HYSTEROSCOPY WITH MYOSURE
Anesthesia: General | Site: Vagina

## 2020-05-09 MED ORDER — LIDOCAINE 2% (20 MG/ML) 5 ML SYRINGE
INTRAMUSCULAR | Status: DC | PRN
  Administered 2020-05-09: 60 mg via INTRAVENOUS

## 2020-05-09 MED ORDER — LIDOCAINE 2% (20 MG/ML) 5 ML SYRINGE
INTRAMUSCULAR | Status: AC
  Filled 2020-05-09: qty 5

## 2020-05-09 MED ORDER — FENTANYL CITRATE (PF) 100 MCG/2ML IJ SOLN
INTRAMUSCULAR | Status: AC
  Filled 2020-05-09: qty 2

## 2020-05-09 MED ORDER — ONDANSETRON HCL 4 MG/2ML IJ SOLN
INTRAMUSCULAR | Status: AC
  Filled 2020-05-09: qty 2

## 2020-05-09 MED ORDER — FENTANYL CITRATE (PF) 100 MCG/2ML IJ SOLN
INTRAMUSCULAR | Status: DC | PRN
  Administered 2020-05-09 (×2): 50 ug via INTRAVENOUS

## 2020-05-09 MED ORDER — PROPOFOL 10 MG/ML IV BOLUS
INTRAVENOUS | Status: AC
  Filled 2020-05-09: qty 20

## 2020-05-09 MED ORDER — KETOROLAC TROMETHAMINE 30 MG/ML IJ SOLN
INTRAMUSCULAR | Status: DC | PRN
  Administered 2020-05-09: 30 mg via INTRAVENOUS

## 2020-05-09 MED ORDER — MIDAZOLAM HCL 5 MG/5ML IJ SOLN
INTRAMUSCULAR | Status: DC | PRN
  Administered 2020-05-09: 2 mg via INTRAVENOUS

## 2020-05-09 MED ORDER — ONDANSETRON HCL 4 MG/2ML IJ SOLN
INTRAMUSCULAR | Status: DC | PRN
  Administered 2020-05-09: 4 mg via INTRAVENOUS

## 2020-05-09 MED ORDER — KETOROLAC TROMETHAMINE 30 MG/ML IJ SOLN
INTRAMUSCULAR | Status: AC
  Filled 2020-05-09: qty 1

## 2020-05-09 MED ORDER — PROPOFOL 10 MG/ML IV BOLUS
INTRAVENOUS | Status: DC | PRN
  Administered 2020-05-09: 150 mg via INTRAVENOUS

## 2020-05-09 MED ORDER — LACTATED RINGERS IV SOLN: INTRAVENOUS | Status: DC

## 2020-05-09 MED ORDER — DEXAMETHASONE SODIUM PHOSPHATE 4 MG/ML IJ SOLN
INTRAMUSCULAR | Status: DC | PRN
  Administered 2020-05-09: 8 mg via INTRAVENOUS

## 2020-05-09 MED ORDER — SILVER NITRATE-POT NITRATE 75-25 % EX MISC
CUTANEOUS | Status: DC | PRN
  Administered 2020-05-09: 2

## 2020-05-09 MED ORDER — MIDAZOLAM HCL 2 MG/2ML IJ SOLN
INTRAMUSCULAR | Status: AC
  Filled 2020-05-09: qty 2

## 2020-05-09 MED ORDER — FENTANYL CITRATE (PF) 100 MCG/2ML IJ SOLN
25.0000 ug | INTRAMUSCULAR | Status: DC | PRN
  Administered 2020-05-09: 25 ug via INTRAVENOUS

## 2020-05-09 MED ORDER — ACETAMINOPHEN 500 MG PO TABS
ORAL_TABLET | ORAL | Status: AC
  Filled 2020-05-09: qty 2

## 2020-05-09 MED ORDER — ACETAMINOPHEN 500 MG PO TABS
1000.0000 mg | ORAL_TABLET | Freq: Once | ORAL | Status: AC
  Administered 2020-05-09: 1000 mg via ORAL

## 2020-05-09 MED ORDER — DEXAMETHASONE SODIUM PHOSPHATE 10 MG/ML IJ SOLN
INTRAMUSCULAR | Status: AC
  Filled 2020-05-09: qty 1

## 2020-05-09 MED ORDER — PROMETHAZINE HCL 25 MG/ML IJ SOLN: 6.2500 mg | INTRAMUSCULAR | Status: DC | PRN

## 2020-05-09 SURGICAL SUPPLY — 25 items
CATH ROBINSON RED A/P 16FR (CATHETERS) IMPLANT
COVER WAND RF STERILE (DRAPES) ×2 IMPLANT
DEVICE MYOSURE LITE (MISCELLANEOUS) IMPLANT
DEVICE MYOSURE REACH (MISCELLANEOUS) ×1 IMPLANT
DILATOR CANAL MILEX (MISCELLANEOUS) IMPLANT
GAUZE 4X4 16PLY RFD (DISPOSABLE) ×2 IMPLANT
GLOVE BIO SURGEON STRL SZ 6.5 (GLOVE) ×2 IMPLANT
GLOVE BIOGEL PI IND STRL 6.5 (GLOVE) IMPLANT
GLOVE BIOGEL PI IND STRL 7.5 (GLOVE) IMPLANT
GLOVE BIOGEL PI INDICATOR 6.5 (GLOVE) ×1
GLOVE BIOGEL PI INDICATOR 7.5 (GLOVE) ×2
GOWN STRL REUS W/TWL LRG LVL3 (GOWN DISPOSABLE) ×2 IMPLANT
IV NS IRRIG 3000ML ARTHROMATIC (IV SOLUTION) ×2 IMPLANT
KIT PROCEDURE FLUENT (KITS) ×2 IMPLANT
KIT TURNOVER CYSTO (KITS) ×2 IMPLANT
MYOSURE XL FIBROID (MISCELLANEOUS)
PACK VAGINAL MINOR WOMEN LF (CUSTOM PROCEDURE TRAY) ×2 IMPLANT
PAD OB MATERNITY 4.3X12.25 (PERSONAL CARE ITEMS) ×2 IMPLANT
PAD PREP 24X48 CUFFED NSTRL (MISCELLANEOUS) ×2 IMPLANT
SEAL CERVICAL OMNI LOK (ABLATOR) IMPLANT
SEAL ROD LENS SCOPE MYOSURE (ABLATOR) ×2 IMPLANT
SYR 20ML LL LF (SYRINGE) IMPLANT
SYSTEM TISS REMOVAL MYOSURE XL (MISCELLANEOUS) IMPLANT
TOWEL OR 17X26 10 PK STRL BLUE (TOWEL DISPOSABLE) ×2 IMPLANT
WATER STERILE IRR 500ML POUR (IV SOLUTION) IMPLANT

## 2020-05-09 NOTE — Interval H&P Note (Signed)
History and Physical Interval Note:  05/09/2020 10:54 AM  Denise Summers  has presented today for surgery, with the diagnosis of Abnormal uterine bleeding, History of endometrial ablation, Cervical stenosis.  The various methods of treatment have been discussed with the patient and family. After consideration of risks, benefits and other options for treatment, the patient has consented to  Procedure(s): DILATATION & CURETTAGE/HYSTEROSCOPY WITH MYOSURE (N/A) OPERATIVE ULTRASOUND (N/A) as a surgical intervention.  The patient's history has been reviewed, patient examined, no change in status, stable for surgery.  I have reviewed the patient's chart and labs.  Questions were answered to the patient's satisfaction.     Romualdo Bolk

## 2020-05-09 NOTE — Anesthesia Preprocedure Evaluation (Signed)
Anesthesia Evaluation  Patient identified by MRN, date of birth, ID band Patient awake    Reviewed: Allergy & Precautions, NPO status , Patient's Chart, lab work & pertinent test results  Airway Mallampati: III  TM Distance: >3 FB Neck ROM: Full    Dental  (+) Dental Advisory Given   Pulmonary neg pulmonary ROS,    breath sounds clear to auscultation       Cardiovascular negative cardio ROS   Rhythm:Regular Rate:Normal     Neuro/Psych negative neurological ROS     GI/Hepatic Neg liver ROS, GERD  ,S/p gastric sleeve   Endo/Other  negative endocrine ROS  Renal/GU negative Renal ROS     Musculoskeletal   Abdominal   Peds  Hematology negative hematology ROS (+)   Anesthesia Other Findings   Reproductive/Obstetrics                             Anesthesia Physical Anesthesia Plan  ASA: II  Anesthesia Plan: General   Post-op Pain Management:    Induction: Intravenous  PONV Risk Score and Plan: 3 and Dexamethasone, Ondansetron, Treatment may vary due to age or medical condition and Midazolam  Airway Management Planned: LMA  Additional Equipment: None  Intra-op Plan:   Post-operative Plan: Extubation in OR  Informed Consent: I have reviewed the patients History and Physical, chart, labs and discussed the procedure including the risks, benefits and alternatives for the proposed anesthesia with the patient or authorized representative who has indicated his/her understanding and acceptance.     Dental advisory given  Plan Discussed with:   Anesthesia Plan Comments:         Anesthesia Quick Evaluation

## 2020-05-09 NOTE — Op Note (Signed)
Preoperative Diagnosis: Abnormal uterine bleeding, history of endometrial biopsy  Postoperative Diagnosis: same  Procedure: Hysteroscopy, dilation and curettage under ultrasound guidance  Surgeon: Dr Gertie Exon  Assistants: None  Anesthesia: General via LMA  EBL: minimal  Fluids: 700  Fluid deficit: 65 cc  Urine output: 200 cc  Indications for surgery: The patient is a 49 yo female, who presented with abnormal uterine bleeding years after having an endometrial ablation. Work up included an ultrasound that showed an irregular thin endometrium, consistent with prior ablation. She continued to have bleeding and an attempted endometrial biopsy was done without success. The risks of the surgery were reviewed with the patient and the consent form was signed prior to her surgery.  Findings: EUA: top normal sized uterus, no adnexal masses. Hysteroscopy under ultrasound guidance revealed a scarred uterine cavity with some thickened endometrium. No tubal ostia were seen.   Specimens: Endometrial curettings.    Procedure: The patient was taken to the operating room with an IV in place. She was placed in the dorsal lithotomy position and anesthesia was administered. She was prepped and draped in the usual sterile fashion for a vaginal procedure. A weighted speculum was placed in the vagina and a single tooth tenaculum was placed on the anterior lip of the cervix. The cervix was dilated to a #6.5 hagar dilator under direct visualization with the ultrasound. I could see that I was in the endometrial cavity. The uterus was sounded to 7-8 cm. The myosure hysteroscope was inserted into the uterine cavity. With continuous infusion of normal saline, the uterine cavity was visualized with the above findings. The myosure reach was used to remove the thickened endometrium. The myosure was then removed. The cavity was then curetted with the small sharp curette. The cavity had the characteristically gritty  texture at the end of the procedure. The curette and the single tooth tenaculum were removed. Oozing from the tenaculum site was stopped with pressure and silver nitrate. The speculum was removed. The patients perineum was cleansed of betadine and she was taken out of the dorsal lithotomy position.  Upon awakening the LMA was removed and the patient was transferred to the recovery room in stable and awake condition.  The sponge and instrument count were correct. There were no complications.

## 2020-05-09 NOTE — Anesthesia Postprocedure Evaluation (Signed)
Anesthesia Post Note  Patient: TAMARAH BHULLAR  Procedure(s) Performed: DILATATION & CURETTAGE/HYSTEROSCOPY WITH MYOSURE (N/A Vagina ) OPERATIVE ULTRASOUND (N/A Vagina )     Patient location during evaluation: PACU Anesthesia Type: General Level of consciousness: awake and alert Pain management: pain level controlled Vital Signs Assessment: post-procedure vital signs reviewed and stable Respiratory status: spontaneous breathing, nonlabored ventilation, respiratory function stable and patient connected to nasal cannula oxygen Cardiovascular status: blood pressure returned to baseline and stable Postop Assessment: no apparent nausea or vomiting Anesthetic complications: no    Last Vitals:  Vitals:   05/09/20 1257 05/09/20 1319  BP: 107/66 120/63  Pulse: (!) 48 (!) 55  Resp: 16 16  Temp:    SpO2: 100% 100%    Last Pain:  Vitals:   05/09/20 1319  TempSrc:   PainSc: 2                  Kennieth Rad

## 2020-05-09 NOTE — Discharge Instructions (Signed)
DISCHARGE INSTRUCTIONS: HYSTEROSCOPY / ENDOMETRIAL ABLATION The following instructions have been prepared to help you care for yourself upon your return home.    May take Ibuprofen after 6:00PM TODAY May take stool softner while taking narcotic pain medication to prevent constipation.  Drink plenty of water.  Personal hygiene: Marland Kitchen Use sanitary pads for vaginal drainage, not tampons. . Shower the day after your procedure. . NO tub baths, pools or Jacuzzis for 2-3 weeks. . Wipe front to back after using the bathroom.    Activity and limitations: . Do NOT drive or operate any equipment for 24 hours. The effects of anesthesia are still present and drowsiness may result. . Do NOT rest in bed all day. . Walking is encouraged. . Walk up and down stairs slowly. . You may resume your normal activity in one to two days or as indicated by your physician. Sexual activity: NO intercourse for at least 2 weeks after the procedure, or as indicated by your Doctor.  Diet: Eat a light meal as desired this evening. You may resume your usual diet tomorrow.  Return to Work: You may resume your work activities in one to two days or as indicated by Therapist, sports.  What to expect after your surgery: Expect to have vaginal bleeding/discharge for 2-3 days and spotting for up to 10 days. It is not unusual to have soreness for up to 1-2 weeks. You may have a slight burning sensation when you urinate for the first day. Mild cramps may continue for a couple of days. You may have a regular period in 2-6 weeks.  Call your doctor for any of the following: . Excessive vaginal bleeding or clotting, saturating and changing one pad every hour. . Inability to urinate 6 hours after discharge from hospital. . Pain not relieved by pain medication. . Fever of 100.4 F or greater. . Unusual vaginal discharge or odor.  Return to office _________________Call for an appointment ___________________ Patient's signature:  ______________________ Nurse's signature ________________________     Post Anesthesia Home Care Instructions  Activity: Get plenty of rest for the remainder of the day. A responsible individual must stay with you for 24 hours following the procedure.  For the next 24 hours, DO NOT: -Drive a car -Advertising copywriter -Drink alcoholic beverages -Take any medication unless instructed by your physician -Make any legal decisions or sign important papers.  Meals: Start with liquid foods such as gelatin or soup. Progress to regular foods as tolerated. Avoid greasy, spicy, heavy foods. If nausea and/or vomiting occur, drink only clear liquids until the nausea and/or vomiting subsides. Call your physician if vomiting continues.  Special Instructions/Symptoms: Your throat may feel dry or sore from the anesthesia or the breathing tube placed in your throat during surgery. If this causes discomfort, gargle with warm salt water. The discomfort should disappear within 24 hours.  If you had a scopolamine patch placed behind your ear for the management of post- operative nausea and/or vomiting:  1. The medication in the patch is effective for 72 hours, after which it should be removed.  Wrap patch in a tissue and discard in the trash. Wash hands thoroughly with soap and water. 2. You may remove the patch earlier than 72 hours if you experience unpleasant side effects which may include dry mouth, dizziness or visual disturbances. 3. Avoid touching the patch. Wash your hands with soap and water after contact with the patch.

## 2020-05-09 NOTE — Transfer of Care (Signed)
Immediate Anesthesia Transfer of Care Note  Patient: Denise Summers  Procedure(s) Performed: Procedure(s) (LRB): DILATATION & CURETTAGE/HYSTEROSCOPY WITH MYOSURE (N/A) OPERATIVE ULTRASOUND (N/A)  Patient Location: PACU  Anesthesia Type: General  Level of Consciousness: awake, alert  and oriented  Airway & Oxygen Therapy: Patient Spontanous Breathing and Patient connected to face mask oxygen  Post-op Assessment: Report given to PACU RN and Post -op Vital signs reviewed and stable  Post vital signs: Reviewed and stable  Complications: No apparent anesthesia complications  Last Vitals:  Vitals Value Taken Time  BP 90/53 05/09/20 1215  Temp    Pulse 57 05/09/20 1219  Resp 13 05/09/20 1219  SpO2 100 % 05/09/20 1219  Vitals shown include unvalidated device data.  Last Pain:  Vitals:   05/09/20 0910  TempSrc: Oral  PainSc: 0-No pain      Patients Stated Pain Goal: 4 (05/09/20 0910)

## 2020-05-09 NOTE — Anesthesia Procedure Notes (Signed)
Procedure Name: LMA Insertion Date/Time: 05/09/2020 11:44 AM Performed by: Norva Pavlov, CRNA Pre-anesthesia Checklist: Patient identified, Emergency Drugs available, Suction available and Patient being monitored Patient Re-evaluated:Patient Re-evaluated prior to induction Oxygen Delivery Method: Circle system utilized Preoxygenation: Pre-oxygenation with 100% oxygen Induction Type: IV induction Ventilation: Mask ventilation without difficulty LMA: LMA inserted LMA Size: 4.0 Number of attempts: 1 Airway Equipment and Method: Bite block Placement Confirmation: positive ETCO2 Tube secured with: Tape Dental Injury: Teeth and Oropharynx as per pre-operative assessment

## 2020-05-10 ENCOUNTER — Telehealth: Payer: Self-pay | Admitting: Obstetrics and Gynecology

## 2020-05-10 LAB — SURGICAL PATHOLOGY

## 2020-05-10 NOTE — Telephone Encounter (Signed)
Patient has some questions regarding her recent results that she viewed on MyChart.

## 2020-05-10 NOTE — Telephone Encounter (Signed)
Spoke with pt. Pt states seeing pathology results on mychart since having D&C hysteroscopy with myosure on 05/09/20. Advised will review with Dr Oscar La and get recommendations and return call to pt. Pt agreeable.   Pt also states having vaginal bleeding since procedure yesterday and changed 3 pads during last night and only changed 1 pad today. Pt states not saturating and no clots. Denies vaginal discharge and odor.  Pt wanting to know how long to expect bleeding since procedure? Advised will review and return call. Pt agreeable.   Routing to Dr Oscar La

## 2020-05-10 NOTE — Telephone Encounter (Signed)
I called the patient, left a message that I would send her a mychart message. Message sent

## 2020-05-16 ENCOUNTER — Other Ambulatory Visit: Payer: Self-pay

## 2020-05-16 ENCOUNTER — Encounter: Payer: Self-pay | Admitting: Obstetrics and Gynecology

## 2020-05-16 ENCOUNTER — Ambulatory Visit (INDEPENDENT_AMBULATORY_CARE_PROVIDER_SITE_OTHER): Payer: Commercial Managed Care - PPO | Admitting: Obstetrics and Gynecology

## 2020-05-16 ENCOUNTER — Telehealth: Payer: Self-pay | Admitting: Obstetrics and Gynecology

## 2020-05-16 VITALS — BP 122/80 | HR 98 | Temp 98.4°F | Ht 62.0 in

## 2020-05-16 DIAGNOSIS — Z9889 Other specified postprocedural states: Secondary | ICD-10-CM

## 2020-05-16 DIAGNOSIS — N939 Abnormal uterine and vaginal bleeding, unspecified: Secondary | ICD-10-CM

## 2020-05-16 NOTE — Telephone Encounter (Signed)
Spoke with pt. Pt states while sitting at work today, the last hour she states having heavy bleeding. Pt had D&C with Myosure on 05/09/20. Pt denies any clots, heavy lifting at work or having weakness, dizziness or lightheadedness. Pt states changing "pad" from work 3 x in last hour.  Advised will speak to Dr Oscar La. Pt agreeable.   Spoke with Dr Oscar La. Recommendations given to place sanitary pad and call back with bleeding update. Pt agreeable and verbalized understanding.

## 2020-05-16 NOTE — Telephone Encounter (Signed)
Patient returned call

## 2020-05-16 NOTE — Progress Notes (Signed)
GYNECOLOGY  VISIT   HPI: 49 y.o.   Single White or Caucasian Not Hispanic or Latino  female   [id number] with Patient's last menstrual period was 05/01/2020.   here for Post op bleeding after having D&C and hysteroscopy on 05/09/20. She wasn't bleeding or having any trouble until today. She was at work and started to bleed heavily. Saturating through a pad in <1 hour. No pain, not dizzy.   GYNECOLOGIC HISTORY: Patient's last menstrual period was 05/01/2020. Contraception:none Menopausal hormone therapy: none        OB History    Gravida  2   Para      Term      Preterm      AB  1   Living  1     SAB  1   TAB      Ectopic      Multiple      Live Births                 Patient Active Problem List   Diagnosis Date Noted  . Bacterial vaginosis 11/24/2019  . DUB (dysfunctional uterine bleeding) 09/09/2017  . Abnormal finding on mammography 04/19/2016  . Morbid obesity with BMI of 40.0-44.9, adult ([place]) 10/30/2015  . Closed nondisplaced fracture of lateral malleolus of right fibula 02/21/2015    Past Medical History:  Diagnosis Date  . Abnormal uterine bleeding (AUB) since march 2021  . 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 Constipation   . Depression   . GERD (gastroesophageal reflux disease)    silent reflux  . Obesity     Past Surgical History:  Procedure Laterality Date  . AUGMENTATION MAMMAPLASTY     2019 and 2020  . CHOLECYSTECTOMY    . DILATATION & CURETTAGE/HYSTEROSCOPY WITH MYOSURE N/A 05/09/2020   Procedure: DILATATION & CURETTAGE/HYSTEROSCOPY WITH MYOSURE;  Surgeon: Salvadore Dom, MD;  Location: [facility];  Service: Gynecology;  Laterality: N/A;  . ENDOMETRIAL ABLATION    . ESOPHAGOGASTRODUODENOSCOPY (EGD) WITH PROPOFOL N/A 07/14/2014   Procedure: ESOPHAGOGASTRODUODENOSCOPY (EGD) WITH PROPOFOL;  Surgeon: Juanita Craver, MD;  Location: [facility];  Service: Endoscopy;   Laterality: N/A;  . LAPAROSCOPIC GASTRIC SLEEVE RESECTION WITH HIATAL HERNIA REPAIR N/A 10/30/2015   Procedure: LAPAROSCOPIC GASTRIC SLEEVE RESECTION WITH REPAIR OF HIATAL HERNIA REPAIR;  Surgeon: Excell Seltzer, MD;  Location: [facility];  Service: General;  Laterality: N/A;  . OPERATIVE ULTRASOUND N/A 05/09/2020   Procedure: OPERATIVE ULTRASOUND;  Surgeon: Salvadore Dom, MD;  Location: [facility];  Service: Gynecology;  Laterality: N/A;  . TUBAL LIGATION    . UPPER GI ENDOSCOPY N/A 10/30/2015   Procedure: UPPER GI ENDOSCOPY;  Surgeon: Excell Seltzer, MD;  Location: [facility];  Service: General;  Laterality: N/A;    Current Outpatient Medications  Medication Sig Dispense Refill  . cholecalciferol (VITAMIN D) 1000 units tablet Take 1,000 Units by mouth daily.    . cyanocobalamin (,VITAMIN B-12,) 1000 MCG/ML injection Inject 1 mL into the skin every 30 (thirty) days.  1  . lamoTRIgine (LAMICTAL) 25 MG tablet Take 25 mg by mouth daily.    . Multiple Vitamin (MULTIVITAMIN WITH MINERALS) TABS tablet Take 1 tablet by mouth daily.     No current facility-administered medications for this visit.     ALLERGIES: Patient has no known allergies.  Family History  Problem Relation Age of Onset  . Hypertension Mother   .  Heart attack Father   . Heart disease Father   . Diabetes Brother   . Hypertension Brother   . Heart disease Brother   . Heart attack Brother   . Bone cancer Maternal Grandmother   . Stroke Maternal Grandfather     Social History   Socioeconomic History  . Marital status: Single    Spouse name: Not on file  . Number of children: Not on file  . Years of education: Not on file  . Highest education level: Not on file  Occupational History  . Not on file  Tobacco Use  . Smoking status: Never Smoker  . Smokeless tobacco: Never Used  Substance and Sexual Activity  . Alcohol use: Not Currently  . Drug use: No  . Sexual activity: Yes    Birth  control/protection: Surgical    Comment: ablation & btl  Other Topics Concern  . Not on file  Social History Narrative  . Not on file   Social Determinants of Health   Financial Resource Strain:   . Difficulty of Paying Living Expenses:   Food Insecurity:   . Worried About Programme researcher, broadcasting/film/video in the Last Year:   . Barista in the Last Year:   Transportation Needs:   . Freight forwarder (Medical):   Marland Kitchen Lack of Transportation (Non-Medical):   Physical Activity:   . Days of Exercise per Week:   . Minutes of Exercise per Session:   Stress:   . Feeling of Stress :   Social Connections:   . Frequency of Communication with Friends and Family:   . Frequency of Social Gatherings with Friends and Family:   . Attends Religious Services:   . Active Member of Clubs or Organizations:   . Attends Banker Meetings:   Marland Kitchen Marital Status:   Intimate Partner Violence:   . Fear of Current or Ex-Partner:   . Emotionally Abused:   Marland Kitchen Physically Abused:   . Sexually Abused:     Review of Systems  Genitourinary:       Vaginal Bleeding.   All other systems reviewed and are negative.   PHYSICAL EXAMINATION:    BP 122/80   Pulse 98   Temp 98.4 F (36.9 C)   Ht 5\' 2"  (1.575 m)   LMP 05/01/2020   SpO2 92%   BMI 27.55 kg/m     General appearance: alert, cooperative and appears stated age  Pelvic: External genitalia:  no lesions              Urethra:  normal appearing urethra with no masses, tenderness or lesions              Bartholins and Skenes: normal                 Vagina: normal appearing vagina with normal color and discharge, no lesions. A large amount of clot was removed from the vagina.              Cervix: no lesions and bleeding from the left tenaculum site. Stopped with several applications of silver nitrate and pressure.               Chaperone was present for exam.  ASSESSMENT Active bleeding 1 week s/p hysteroscopy, D&C. Bleeding from left  tenaculum site. Stopped with silver nitrate. No uterine bleeding.     PLAN Take it easy for the next few days. Call with any concerns.  Routine f/u Hgb  13.1    

## 2020-05-16 NOTE — Telephone Encounter (Signed)
Patient is bleeding heavy after surgery last week.

## 2020-05-16 NOTE — Telephone Encounter (Signed)
Spoke with pt. Pt calling back to give update on bleeding. Pt now has maxi pad on. Pt reports has filled up 1 maxi pad and is half full on the other with clots x 2, as 50 cent piece clot size.    Spoke with Dr Oscar La with update and advised pt to come now to office to be checked. Pt agreeable and verbalized understanding. Pt still at work-in in [place] and can arrive in 20 mins. Ok to keep work in [employer] with Dr Oscar La. Pt denies feeling weak, dizzy or lightheaded. OK to drive.   Routing to Dr Oscar La for review.  Encounter closed.

## 2020-05-17 ENCOUNTER — Telehealth: Payer: Self-pay

## 2020-05-17 ENCOUNTER — Encounter: Payer: Self-pay | Admitting: Obstetrics and Gynecology

## 2020-05-17 NOTE — Telephone Encounter (Signed)
Called and spoke to [place]. She states that she is doing much better has not had any blood today. She has a post opt appointment 05/18/20.

## 2020-05-18 ENCOUNTER — Ambulatory Visit: Payer: Commercial Managed Care - PPO | Admitting: Obstetrics and Gynecology

## 2020-05-19 ENCOUNTER — Other Ambulatory Visit: Payer: Self-pay

## 2020-05-19 ENCOUNTER — Ambulatory Visit (INDEPENDENT_AMBULATORY_CARE_PROVIDER_SITE_OTHER): Payer: Commercial Managed Care - PPO | Admitting: Obstetrics & Gynecology

## 2020-05-19 ENCOUNTER — Telehealth: Payer: Self-pay

## 2020-05-19 ENCOUNTER — Encounter: Payer: Self-pay | Admitting: Obstetrics & Gynecology

## 2020-05-19 VITALS — BP 120/78 | HR 72 | Temp 97.5°F | Resp 16

## 2020-05-19 DIAGNOSIS — Z9889 Other specified postprocedural states: Secondary | ICD-10-CM

## 2020-05-19 DIAGNOSIS — N939 Abnormal uterine and vaginal bleeding, unspecified: Secondary | ICD-10-CM | POA: Diagnosis not present

## 2020-05-19 NOTE — Telephone Encounter (Signed)
Spoke with patient. Patient is s/p D&C on 05/09/20. Patient was seen in office on 05/16/20 for bleeding. Patient reports bleeding did stop, restarted again this morning at 2am. Bleeding is not as heavy as it was when she came into the office, but is getting heavier. Does not require a pad change. Reports pelvic discomfort and fatigue. Denies lightheadedness, weakness, SHOB. Patient is concerned about bleeding and going into the weekend. Recommended OV with covering provider, patient agreeable. OV scheduled for 11:15am, advised patient she is being worked into the schedule, may be delay. Will review with provider and return call if any additional recommendations. Patient agreeable.   Routing to Dr. Hyacinth Meeker.   Cc: Dr. Oscar La   Encounter closed.

## 2020-05-19 NOTE — Telephone Encounter (Signed)
Patient is calling in regards to bleeding. Patient stated she had a procedure (05/09/20) and then was seen in our office for heavy bleeding on (05/16/20). Patient stated the bleeding has started back up and would like to know if it will last long.

## 2020-05-19 NOTE — Progress Notes (Signed)
GYNECOLOGY  VISIT  CC:   Vaginal bleeding  HPI: 49 y.o. G39P0011 Single White or Caucasian female here for post op bleeding.  Pt has hx of hysteroscopy with D&C on 05/09/2020.  She was seen earlier this week on 05/16/2020 due to significant vaginal bleeding that was coming from tenaculum site.  Pt felt bleeding had improved but has started back a little bit today and she was concerned about going into the weekend without being seen.  Is feeling some fatigue.  Hb was 13.1 on 05/16/2020.    Denies lightheadedness, SOB, palpitations.  Just worried about going into the weekend.  GYNECOLOGIC HISTORY: Patient's last menstrual period was 05/01/2020. Contraception: btl Menopausal hormone therapy: none  Patient Active Problem List   Diagnosis Date Noted  . Bacterial vaginosis 11/24/2019  . DUB (dysfunctional uterine bleeding) 09/09/2017  . Abnormal finding on mammography 04/19/2016  . Morbid obesity with BMI of 40.0-44.9, adult ([place]) 10/30/2015  . Closed nondisplaced fracture of lateral malleolus of right fibula 02/21/2015    Past Medical History:  Diagnosis Date  . Abnormal uterine bleeding (AUB) since march 2021  . 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 Constipation   . Depression   . GERD (gastroesophageal reflux disease)    silent reflux  . Obesity     Past Surgical History:  Procedure Laterality Date  . AUGMENTATION MAMMAPLASTY     2019 and 2020  . CHOLECYSTECTOMY    . DILATATION & CURETTAGE/HYSTEROSCOPY WITH MYOSURE N/A 05/09/2020   Procedure: DILATATION & CURETTAGE/HYSTEROSCOPY WITH MYOSURE;  Surgeon: Salvadore Dom, MD;  Location: [facility];  Service: Gynecology;  Laterality: N/A;  . ENDOMETRIAL ABLATION    . ESOPHAGOGASTRODUODENOSCOPY (EGD) WITH PROPOFOL N/A 07/14/2014   Procedure: ESOPHAGOGASTRODUODENOSCOPY (EGD) WITH PROPOFOL;  Surgeon: Juanita Craver, MD;  Location: [facility];  Service: Endoscopy;   Laterality: N/A;  . LAPAROSCOPIC GASTRIC SLEEVE RESECTION WITH HIATAL HERNIA REPAIR N/A 10/30/2015   Procedure: LAPAROSCOPIC GASTRIC SLEEVE RESECTION WITH REPAIR OF HIATAL HERNIA REPAIR;  Surgeon: Excell Seltzer, MD;  Location: [facility];  Service: General;  Laterality: N/A;  . OPERATIVE ULTRASOUND N/A 05/09/2020   Procedure: OPERATIVE ULTRASOUND;  Surgeon: Salvadore Dom, MD;  Location: [facility];  Service: Gynecology;  Laterality: N/A;  . TUBAL LIGATION    . UPPER GI ENDOSCOPY N/A 10/30/2015   Procedure: UPPER GI ENDOSCOPY;  Surgeon: Excell Seltzer, MD;  Location: [facility];  Service: General;  Laterality: N/A;    MEDS:   Current Outpatient Medications on File Prior to Visit  Medication Sig Dispense Refill  . cholecalciferol (VITAMIN D) 1000 units tablet Take 1,000 Units by mouth daily.    . cyanocobalamin (,VITAMIN B-12,) 1000 MCG/ML injection Inject 1 mL into the skin every 30 (thirty) days.  1  . lamoTRIgine (LAMICTAL) 25 MG tablet Take 25 mg by mouth daily.    . Multiple Vitamin (MULTIVITAMIN WITH MINERALS) TABS tablet Take 1 tablet by mouth daily.    . Probiotic Product (PROBIOTIC PO) Take by mouth.     No current facility-administered medications on file prior to visit.    ALLERGIES: Patient has no known allergies.  Family History  Problem Relation Age of Onset  . Hypertension Mother   . Heart attack Father   . Heart disease Father   . Diabetes Brother   . Hypertension Brother   . Heart disease Brother   . Heart attack Brother   .  Bone cancer Maternal Grandmother   . Stroke Maternal Grandfather     SH:  Single, non smoker  Review of Systems  Constitutional: Negative.   HENT: Negative.   Eyes: Negative.   Respiratory: Negative.   Cardiovascular: Negative.   Gastrointestinal: Negative.   Endocrine: Negative.   Genitourinary:       Vaginal bleeding & burning  Musculoskeletal: Negative.   Skin: Negative.   Allergic/Immunologic: Negative.    Neurological: Negative.   Psychiatric/Behavioral: Negative.     PHYSICAL EXAMINATION:    Temp (!) 97.5 F (36.4 C) (Skin)   LMP 05/01/2020     General appearance: alert, cooperative and appears stated age CV:  Regular rate and rhythm Lungs:  clear to auscultation, no wheezes, rales or rhonchi, symmetric air entry Lymph:  no inguinal LAD noted  Pelvic: External genitalia:  no lesions              Urethra:  normal appearing urethra with no masses, tenderness or lesions              Bartholins and Skenes: normal                 Vagina: normal appearing vagina with normal color and discharge, no lesions              Cervix: large nickel sized area on cervix with eschar from prior treatment with silver nitrate.  Oozing coming from this location.                Bimanual Exam:  Uterus:  normal size, contour, position, consistency, mobility, non-tender              Adnexa: no mass, fullness, tenderness  Attempted to evacuate any bleeding from the vagina.  Pt was uncomfortable.  Cervix dabbed with scopette but I was afraid I would knock off eschar so Monsel's solution appled to cervix with good hemostasis present.  Chaperone, Zenovia Jordan, CMA, was present for exam.  Assessment: Post operative bleeding from tenaculum site again today No anemia present on 05/16/2020 so CBC not obtained today  Plan: Pt will follow up with Dr. Oscar La as planned.

## 2020-05-22 ENCOUNTER — Encounter: Payer: Self-pay | Admitting: Obstetrics & Gynecology

## 2020-11-29 ENCOUNTER — Ambulatory Visit: Payer: Commercial Managed Care - PPO | Admitting: Certified Nurse Midwife

## 2020-11-29 ENCOUNTER — Other Ambulatory Visit: Payer: Self-pay | Admitting: Obstetrics and Gynecology

## 2020-11-29 ENCOUNTER — Other Ambulatory Visit: Payer: Self-pay

## 2020-11-29 ENCOUNTER — Encounter: Payer: Self-pay | Admitting: Obstetrics and Gynecology

## 2020-11-29 ENCOUNTER — Ambulatory Visit: Payer: Commercial Managed Care - PPO | Admitting: Obstetrics and Gynecology

## 2020-11-29 VITALS — BP 112/64 | HR 66 | Ht 62.0 in

## 2020-11-29 DIAGNOSIS — N3090 Cystitis, unspecified without hematuria: Secondary | ICD-10-CM | POA: Diagnosis not present

## 2020-11-29 DIAGNOSIS — Z9889 Other specified postprocedural states: Secondary | ICD-10-CM | POA: Diagnosis not present

## 2020-11-29 DIAGNOSIS — Z01419 Encounter for gynecological examination (general) (routine) without abnormal findings: Secondary | ICD-10-CM

## 2020-11-29 DIAGNOSIS — G4700 Insomnia, unspecified: Secondary | ICD-10-CM | POA: Insufficient documentation

## 2020-11-29 DIAGNOSIS — K5900 Constipation, unspecified: Secondary | ICD-10-CM

## 2020-11-29 DIAGNOSIS — N951 Menopausal and female climacteric states: Secondary | ICD-10-CM

## 2020-11-29 DIAGNOSIS — E559 Vitamin D deficiency, unspecified: Secondary | ICD-10-CM | POA: Insufficient documentation

## 2020-11-29 DIAGNOSIS — E039 Hypothyroidism, unspecified: Secondary | ICD-10-CM | POA: Insufficient documentation

## 2020-11-29 DIAGNOSIS — M150 Primary generalized (osteo)arthritis: Secondary | ICD-10-CM | POA: Insufficient documentation

## 2020-11-29 DIAGNOSIS — E782 Mixed hyperlipidemia: Secondary | ICD-10-CM | POA: Insufficient documentation

## 2020-11-29 DIAGNOSIS — F419 Anxiety disorder, unspecified: Secondary | ICD-10-CM | POA: Insufficient documentation

## 2020-11-29 DIAGNOSIS — F3132 Bipolar disorder, current episode depressed, moderate: Secondary | ICD-10-CM | POA: Insufficient documentation

## 2020-11-29 DIAGNOSIS — F329 Major depressive disorder, single episode, unspecified: Secondary | ICD-10-CM | POA: Insufficient documentation

## 2020-11-29 DIAGNOSIS — D518 Other vitamin B12 deficiency anemias: Secondary | ICD-10-CM

## 2020-11-29 DIAGNOSIS — I10 Essential (primary) hypertension: Secondary | ICD-10-CM | POA: Insufficient documentation

## 2020-11-29 DIAGNOSIS — E1165 Type 2 diabetes mellitus with hyperglycemia: Secondary | ICD-10-CM | POA: Insufficient documentation

## 2020-11-29 DIAGNOSIS — Z1231 Encounter for screening mammogram for malignant neoplasm of breast: Secondary | ICD-10-CM

## 2020-11-29 HISTORY — DX: Other vitamin B12 deficiency anemias: D51.8

## 2020-11-29 LAB — POCT URINALYSIS DIPSTICK
Bilirubin, UA: NEGATIVE
Blood, UA: NEGATIVE
Glucose, UA: NEGATIVE
Ketones, UA: NEGATIVE
Leukocytes, UA: NEGATIVE
Nitrite, UA: NEGATIVE
Protein, UA: NEGATIVE
Urobilinogen, UA: NEGATIVE E.U./dL — AB
pH, UA: 7 (ref 5.0–8.0)

## 2020-11-29 MED ORDER — SULFAMETHOXAZOLE-TRIMETHOPRIM 800-160 MG PO TABS
1.0000 | ORAL_TABLET | Freq: Two times a day (BID) | ORAL | 0 refills | Status: DC
Start: 2020-11-29 — End: 2021-03-14

## 2020-11-29 MED ORDER — PHENAZOPYRIDINE HCL 200 MG PO TABS: 200.0000 mg | ORAL_TABLET | Freq: Three times a day (TID) | ORAL | 0 refills | Status: DC | PRN

## 2020-11-29 NOTE — Progress Notes (Signed)
49 y.o. G67P1011 Single White or Caucasian Not Hispanic or Latino female here for annual exam.      Patient states that she has burning with urination and pelvic pressure for the last couple of day. She has frequent urination, small to normal amounts. Urgency to void. No fever, no flank pain.  No LMP recorded. Patient is perimenopausal.          Sexually active: Yes.    The current method of family planning is none.    Exercising: Yes.    Cardio Smoker:  No  Under stress, mom is in rehab locally.   She is s/p hysteroscopy, D&C in 5/21 for AUB and h/o endometrial ablation, benign pathology. No bleeding since her surgery. Tolerable night sweats periodically.  Sexually active, same partner x 3 years, no dyspareunia.   She has a h/o constipation, can go 1-2 weeks without a BM.   Health Maintenance: Pap: 11/24/19 WNL HR HPV Neg  04-16-16 neg HPV HR neg History of abnormal Pap:  no MMG:  01/19/20 density B bi-rads 1 neg  BMD:   None  Colonoscopy: none TDaP:  UTD Gardasil: none    reports that she has never smoked. She has never used smokeless tobacco. She reports previous alcohol use. She reports that she does not use drugs. Works in the [employer], does patient training. Daughter is 73 local, 2 grandchildren and one on the way.   Past Medical History:  Diagnosis Date  . Abnormal uterine bleeding (AUB) since march 2021  . 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 Constipation   . Depression   . GERD (gastroesophageal reflux disease)    silent reflux  . Obesity   . Other vitamin B12 deficiency anemias 11/29/2020    Past Surgical History:  Procedure Laterality Date  . AUGMENTATION MAMMAPLASTY     2019 and 2020  . CHOLECYSTECTOMY    . DILATATION & CURETTAGE/HYSTEROSCOPY WITH MYOSURE N/A 05/09/2020   Procedure: DILATATION & CURETTAGE/HYSTEROSCOPY WITH MYOSURE;  Surgeon: Romualdo Bolk, MD;  Location: [facility];  Service: Gynecology;  Laterality: N/A;  . ENDOMETRIAL ABLATION    . ESOPHAGOGASTRODUODENOSCOPY (EGD) WITH PROPOFOL N/A 07/14/2014   Procedure: ESOPHAGOGASTRODUODENOSCOPY (EGD) WITH PROPOFOL;  Surgeon: Charna Elizabeth, MD;  Location: [facility];  Service: Endoscopy;  Laterality: N/A;  . LAPAROSCOPIC GASTRIC SLEEVE RESECTION WITH HIATAL HERNIA REPAIR N/A 10/30/2015   Procedure: LAPAROSCOPIC GASTRIC SLEEVE RESECTION WITH REPAIR OF HIATAL HERNIA REPAIR;  Surgeon: Glenna Fellows, MD;  Location: [facility];  Service: General;  Laterality: N/A;  . OPERATIVE ULTRASOUND N/A 05/09/2020   Procedure: OPERATIVE ULTRASOUND;  Surgeon: Romualdo Bolk, MD;  Location: [facility];  Service: Gynecology;  Laterality: N/A;  . TUBAL LIGATION    . UPPER GI ENDOSCOPY N/A 10/30/2015   Procedure: UPPER GI ENDOSCOPY;  Surgeon: Glenna Fellows, MD;  Location: [facility];  Service: General;  Laterality: N/A;    Current Outpatient Medications  Medication Sig Dispense Refill  . cholecalciferol (VITAMIN D) 1000 units tablet Take 1,000 Units by mouth daily.    . cyanocobalamin (,VITAMIN B-12,) 1000 MCG/ML injection Inject 1 mL into the skin every 30 (thirty) days.  1  . Multiple Vitamin (MULTIVITAMIN WITH MINERALS) TABS tablet Take 1 tablet by mouth daily.     No current facility-administered medications for this visit.    Family History  Problem Relation Age of Onset  . Hypertension Mother   .  Heart attack Father   . Heart disease Father   . Diabetes Brother   . Hypertension Brother   . Heart disease Brother   . Heart attack Brother   . Bone cancer Maternal Grandmother   . Stroke Maternal Grandfather     Review of Systems  Genitourinary: Positive for dysuria and vaginal discharge.  All other systems reviewed and are negative.   Exam:   BP 112/64   Pulse 66   Ht 5\' 2"  (1.575 m)   SpO2 99%   BMI 27.55 kg/m   Weight change: @WEIGHTCHANGE @ Height:   Height: 5\' 2"  (157.5 cm)  Ht  Readings from Last 3 Encounters:  11/29/20 5\' 2"  (1.575 m)  05/16/20 5\' 2"  (1.575 m)  05/09/20 5\' 2"  (1.575 m)    General appearance: alert, cooperative and appears stated age Head: Normocephalic, without obvious abnormality, atraumatic Neck: no adenopathy, supple, symmetrical, trachea midline and thyroid normal to inspection and palpation Lungs: clear to auscultation bilaterally Cardiovascular: regular rate and rhythm Breasts: normal appearance, no masses or tenderness, bilateral implants are soft Abdomen: soft, non-tender; non distended,  no masses,  no organomegaly Extremities: extremities normal, atraumatic, no cyanosis or edema Skin: Skin color, texture, turgor normal. No rashes or lesions Lymph nodes: Cervical, supraclavicular, and axillary nodes normal. No abnormal inguinal nodes palpated Neurologic: Grossly normal   Pelvic: External genitalia:  no lesions              Urethra:  normal appearing urethra with no masses, tenderness or lesions              Bartholins and Skenes: normal                 Vagina: normal appearing vagina with normal color and discharge, no lesions              Cervix: no lesions               Bimanual Exam:  Uterus:  normal size, contour, position, consistency, mobility, non-tender and anteverted              Adnexa: no mass, fullness, tenderness               Rectovaginal: Confirms               Anus:  normal sphincter tone, no lesions  12/01/20 chaperoned for the exam.  Urine dip: negative  A:  Well Woman with normal exam  H/O gastric sleeve, labs with primary  Cystitis  Constipation  Mild vasomotor symptoms, discussed perimenopause, information given.   P:   No pap this year  Mammogram in 2/21  Send urine for ua, c&s  Treat with bactrim and pyridium  Discussed breast self exam  Discussed calcium and vit D intake  Referral to GI for constipation, information given  Try magnesium 500 mg po qd.

## 2020-11-29 NOTE — Patient Instructions (Addendum)
EXERCISE   We recommended that you start or continue a regular exercise program for good health. Physical activity is anything that gets your body moving, some is better than none. The CDC recommends 150 minutes per week of Moderate-Intensity Aerobic Activity and 2 or more days of Muscle Strengthening Activity.  Benefits of exercise are limitless: helps weight loss/weight maintenance, improves mood and energy, helps with depression and anxiety, improves sleep, tones and strengthens muscles, improves balance, improves bone density, protects from chronic conditions such as heart disease, high blood pressure and diabetes and so much more. To learn more visit: https://www.cdc.gov/physicalactivity/index.html  DIET: Good nutrition starts with a healthy diet of fruits, vegetables, whole grains, and lean protein sources. Drink plenty of water for hydration. Minimize empty calories, sodium, sweets. For more information about dietary recommendations visit: https://health.gov/our-work/nutrition-physical-activity/dietary-guidelines and https://www.myplate.gov/  ALCOHOL:  Women should limit their alcohol intake to no more than 7 drinks/beers/glasses of wine (combined, not each!) per week. Moderation of alcohol intake to this level decreases your risk of breast cancer and liver damage.  If you are concerned that you may have a problem, or your friends have told you they are concerned about your drinking, there are many resources to help. A well-known program that is free, effective, and available to all people all over the nation is Alcoholics Anonymous.  Check out this site to learn more: https://www.aa.org/   CALCIUM AND VITAMIN D:  Adequate intake of calcium and Vitamin D are recommended for bone health.  You should be getting between 1000-1200 mg of calcium and 800 units of Vitamin D daily between diet and supplements  PAP SMEARS:  Pap smears, to check for cervical cancer or precancers,  have traditionally been  done yearly, scientific advances have shown that most women can have pap smears less often.  However, every woman still should have a physical exam from her gynecologist every year. It will include a breast check, inspection of the vulva and vagina to check for abnormal growths or skin changes, a visual exam of the cervix, and then an exam to evaluate the size and shape of the uterus and ovaries. We will also provide age appropriate advice regarding health maintenance, like when you should have certain vaccines, screening for sexually transmitted diseases, bone density testing, colonoscopy, mammograms, etc.   MAMMOGRAMS:  All women over 40 years old should have a routine mammogram.   COLON CANCER SCREENING: Now recommend starting at age 45. At this time colonoscopy is not covered for routine screening until 50. There are take home tests that can be done between 45-49.   COLONOSCOPY:  Colonoscopy to screen for colon cancer is recommended for all women at age 50.  We know, you hate the idea of the prep.  We agree, BUT, having colon cancer and not knowing it is worse!!  Colon cancer so often starts as a polyp that can be seen and removed at colonscopy, which can quite literally save your life!  And if your first colonoscopy is normal and you have no family history of colon cancer, most women don't have to have it again for 10 years.  Once every ten years, you can do something that may end up saving your life, right?  We will be happy to help you get it scheduled when you are ready.  Be sure to check your insurance coverage so you understand how much it will cost.  It may be covered as a preventative service at no cost, but you should check   your particular policy.      Breast Self-Awareness Breast self-awareness means being familiar with how your breasts look and feel. It involves checking your breasts regularly and reporting any changes to your health care provider. Practicing breast self-awareness is  important. A change in your breasts can be a sign of a serious medical problem. Being familiar with how your breasts look and feel allows you to find any problems early, when treatment is more likely to be successful. All women should practice breast self-awareness, including women who have had breast implants. How to do a breast self-exam One way to learn what is normal for your breasts and whether your breasts are changing is to do a breast self-exam. To do a breast self-exam: Look for Changes  1. Remove all the clothing above your waist. 2. Stand in front of a mirror in a room with good lighting. 3. Put your hands on your hips. 4. Push your hands firmly downward. 5. Compare your breasts in the mirror. Look for differences between them (asymmetry), such as: ? Differences in shape. ? Differences in size. ? Puckers, dips, and bumps in one breast and not the other. 6. Look at each breast for changes in your skin, such as: ? Redness. ? Scaly areas. 7. Look for changes in your nipples, such as: ? Discharge. ? Bleeding. ? Dimpling. ? Redness. ? A change in position. Feel for Changes Carefully feel your breasts for lumps and changes. It is best to do this while lying on your back on the floor and again while sitting or standing in the shower or tub with soapy water on your skin. Feel each breast in the following way:  Place the arm on the side of the breast you are examining above your head.  Feel your breast with the other hand.  Start in the nipple area and make  inch (2 cm) overlapping circles to feel your breast. Use the pads of your three middle fingers to do this. Apply light pressure, then medium pressure, then firm pressure. The light pressure will allow you to feel the tissue closest to the skin. The medium pressure will allow you to feel the tissue that is a little deeper. The firm pressure will allow you to feel the tissue close to the ribs.  Continue the overlapping circles,  moving downward over the breast until you feel your ribs below your breast.  Move one finger-width toward the center of the body. Continue to use the  inch (2 cm) overlapping circles to feel your breast as you move slowly up toward your collarbone.  Continue the up and down exam using all three pressures until you reach your armpit.  Write Down What You Find  Write down what is normal for each breast and any changes that you find. Keep a written record with breast changes or normal findings for each breast. By writing this information down, you do not need to depend only on memory for size, tenderness, or location. Write down where you are in your menstrual cycle, if you are still menstruating. If you are having trouble noticing differences in your breasts, do not get discouraged. With time you will become more familiar with the variations in your breasts and more comfortable with the exam. How often should I examine my breasts? Examine your breasts every month. If you are breastfeeding, the best time to examine your breasts is after a feeding or after using a breast pump. If you menstruate, the best time to   examine your breasts is 5-7 days after your period is over. During your period, your breasts are lumpier, and it may be more difficult to notice changes. When should I see my health care provider? See your health care provider if you notice:  A change in shape or size of your breasts or nipples.  A change in the skin of your breast or nipples, such as a reddened or scaly area.  Unusual discharge from your nipples.  A lump or thick area that was not there before.  Pain in your breasts.  Anything that concerns you.  Perimenopause  Perimenopause is the normal time of life before and after menstrual periods stop completely (menopause). Perimenopause can begin 2-8 years before menopause, and it usually lasts for 1 year after menopause. During perimenopause, the ovaries may or may not  produce an egg. What are the causes? This condition is caused by a natural change in hormone levels that happens as you get older. What increases the risk? This condition is more likely to start at an earlier age if you have certain medical conditions or treatments, including:  A tumor of the pituitary gland in the brain.  A disease that affects the ovaries and hormone production.  Radiation treatment for cancer.  Certain cancer treatments, such as chemotherapy or hormone (anti-estrogen) therapy.  Heavy smoking and excessive alcohol use.  Family history of early menopause. What are the signs or symptoms? Perimenopausal changes affect each woman differently. Symptoms of this condition may include:  Hot flashes.  Night sweats.  Irregular menstrual periods.  Decreased sex drive.  Vaginal dryness.  Headaches.  Mood swings.  Depression.  Memory problems or trouble concentrating.  Irritability.  Tiredness.  Weight gain.  Anxiety.  Trouble getting pregnant. How is this diagnosed? This condition is diagnosed based on your medical history, a physical exam, your age, your menstrual history, and your symptoms. Hormone tests may also be done. How is this treated? In some cases, no treatment is needed. You and your health care provider should make a decision together about whether treatment is necessary. Treatment will be based on your individual condition and preferences. Various treatments are available, such as:  Menopausal hormone therapy (MHT).  Medicines to treat specific symptoms.  Acupuncture.  Vitamin or herbal supplements. Before starting treatment, make sure to let your health care provider know if you have a personal or family history of:  Heart disease.  Breast cancer.  Blood clots.  Diabetes.  Osteoporosis. Follow these instructions at home: Lifestyle  Do not use any products that contain nicotine or tobacco, such as cigarettes and  e-cigarettes. If you need help quitting, ask your health care provider.  Eat a balanced diet that includes fresh fruits and vegetables, whole grains, soybeans, eggs, lean meat, and low-fat dairy.  Get at least 30 minutes of physical activity on 5 or more days each week.  Avoid alcoholic and caffeinated beverages, as well as spicy foods. This may help prevent hot flashes.  Get 7-8 hours of sleep each night.  Dress in layers that can be removed to help you manage hot flashes.  Find ways to manage stress, such as deep breathing, meditation, or journaling. General instructions  Keep track of your menstrual periods, including: ? When they occur. ? How heavy they are and how long they last. ? How much time passes between periods.  Keep track of your symptoms, noting when they start, how often you have them, and how long they last.  Take   over-the-counter and prescription medicines only as told by your health care provider.  Take vitamin supplements only as told by your health care provider. These may include calcium, vitamin E, and vitamin D.  Use vaginal lubricants or moisturizers to help with vaginal dryness and improve comfort during sex.  Talk with your health care provider before starting any herbal supplements.  Keep all follow-up visits as told by your health care provider. This is important. This includes any group therapy or counseling. Contact a health care provider if:  You have heavy vaginal bleeding or pass blood clots.  Your period lasts more than 2 days longer than normal.  Your periods are recurring sooner than 21 days.  You bleed after having sex. Get help right away if:  You have chest pain, trouble breathing, or trouble talking.  You have severe depression.  You have pain when you urinate.  You have severe headaches.  You have vision problems. Summary  Perimenopause is the time when a woman's body begins to move into menopause. This may happen  naturally or as a result of other health problems or medical treatments.  Perimenopause can begin 2-8 years before menopause, and it usually lasts for 1 year after menopause.  Perimenopausal symptoms can be managed through medicines, lifestyle changes, and complementary therapies such as acupuncture. This information is not intended to replace advice given to you by your health care provider. Make sure you discuss any questions you have with your health care provider. Document Revised: 11/14/2017 Document Reviewed: 01/07/2017 Elsevier Patient Education  2020 [employer].  About Constipation  Constipation Overview Constipation is the most common gastrointestinal complaint -- about 4 million Americans experience constipation and make 2.5 million physician visits a year to get help for the problem.  Constipation can occur when the colon absorbs too much water, the colon's muscle contraction is slow or sluggish, and/or there is delayed transit time through the colon.  The result is stool that is hard and dry.  Indicators of constipation include straining during bowel movements greater than 25% of the time, having fewer than three bowel movements per week, and/or the feeling of incomplete evacuation.  There are established guidelines ([place] II ) for defining constipation. A person needs to have two or more of the following symptoms for at least 12 weeks (not necessarily consecutive) in the preceding 12 months: . Straining in  greater than 25% of bowel movements . Lumpy or hard stools in greater than 25% of bowel movements . Sensation of incomplete emptying in greater than 25% of bowel movements . Sensation of anorectal obstruction/blockade in greater than 25% of bowel movements . Manual maneuvers to help empty greater than 25% of bowel movements (e.g., digital evacuation, support of the pelvic floor)  . Less than  3 bowel movements/week . Loose stools are not present, and criteria for irritable  bowel syndrome are insufficient  Common Causes of Constipation . Lack of fiber in your diet . Lack of physical activity . Medications, including iron and calcium supplements  . Dairy intake . Dehydration . Abuse of laxatives  Travel  Irritable Bowel Syndrome  Pregnancy  Luteal phase of menstruation (after ovulation and before menses)  Colorectal problems  Intestinal Dysfunction  Treating Constipation  There are several ways of treating constipation, including changes to diet and exercise, use of laxatives, adjustments to the pelvic floor, and scheduled toileting.  These treatments include: . increasing fiber and fluids in the diet  . increasing physical activity . learning muscle  coordination   learning proper toileting techniques and toileting modifications   designing and sticking  to a toileting schedule     2007, [employer] Doc.22  You can try magnesium 500 mg a day for constipation.

## 2020-11-30 LAB — URINALYSIS, MICROSCOPIC ONLY
Bacteria, UA: NONE SEEN
Casts: NONE SEEN /lpf
RBC, Urine: NONE SEEN /hpf (ref 0–2)
WBC, UA: NONE SEEN /hpf (ref 0–5)

## 2020-12-03 LAB — URINE CULTURE

## 2020-12-07 ENCOUNTER — Encounter: Payer: Self-pay | Admitting: Obstetrics and Gynecology

## 2020-12-11 ENCOUNTER — Telehealth: Payer: Self-pay

## 2020-12-11 NOTE — Telephone Encounter (Signed)
Denise Summers [facility] I was wondering if you sent a referral over for a GI dr.

## 2020-12-11 NOTE — Telephone Encounter (Signed)
Sent the following message to patient via MyChart.  Good morning Denise Summers,  I sent your referral for GI to [facility] on December 16th. They will be reaching out to you, to get you scheduled. If you would like to contact their office, the number is [phone]. Please let me know if I can be of any assistance to get you scheduled.  Thank you, Jentry Mcqueary  Encounter closed.

## 2021-01-17 ENCOUNTER — Institutional Professional Consult (permissible substitution): Payer: Commercial Managed Care - PPO | Admitting: Plastic Surgery

## 2021-01-24 ENCOUNTER — Other Ambulatory Visit: Payer: Self-pay | Admitting: Obstetrics and Gynecology

## 2021-01-24 ENCOUNTER — Ambulatory Visit
Admission: RE | Admit: 2021-01-24 | Discharge: 2021-01-24 | Disposition: A | Discharge status: Home / Self Care | Payer: Commercial Managed Care - PPO | Source: Ambulatory Visit | Attending: Obstetrics and Gynecology | Admitting: Obstetrics and Gynecology

## 2021-01-24 ENCOUNTER — Ambulatory Visit: Payer: Commercial Managed Care - PPO

## 2021-01-24 ENCOUNTER — Other Ambulatory Visit: Payer: Self-pay

## 2021-01-24 DIAGNOSIS — Z1231 Encounter for screening mammogram for malignant neoplasm of breast: Secondary | ICD-10-CM

## 2021-01-30 ENCOUNTER — Encounter: Payer: Self-pay | Admitting: Obstetrics and Gynecology

## 2021-01-30 ENCOUNTER — Other Ambulatory Visit: Payer: Self-pay | Admitting: Obstetrics and Gynecology

## 2021-01-30 DIAGNOSIS — R928 Other abnormal and inconclusive findings on diagnostic imaging of breast: Secondary | ICD-10-CM

## 2021-01-30 NOTE — Telephone Encounter (Signed)
I called and spoke with patient , all questions answered.

## 2021-02-01 ENCOUNTER — Ambulatory Visit: Payer: Commercial Managed Care - PPO | Admitting: Internal Medicine

## 2021-02-08 ENCOUNTER — Other Ambulatory Visit: Payer: Self-pay

## 2021-02-08 ENCOUNTER — Ambulatory Visit
Admission: RE | Admit: 2021-02-08 | Discharge: 2021-02-08 | Disposition: A | Discharge status: Home / Self Care | Payer: Commercial Managed Care - PPO | Source: Ambulatory Visit | Attending: Obstetrics and Gynecology | Admitting: Obstetrics and Gynecology

## 2021-02-08 ENCOUNTER — Ambulatory Visit: Payer: Commercial Managed Care - PPO

## 2021-02-08 DIAGNOSIS — R928 Other abnormal and inconclusive findings on diagnostic imaging of breast: Secondary | ICD-10-CM

## 2021-02-09 ENCOUNTER — Other Ambulatory Visit: Payer: Commercial Managed Care - PPO

## 2021-02-12 ENCOUNTER — Other Ambulatory Visit: Payer: Commercial Managed Care - PPO

## 2021-03-14 ENCOUNTER — Ambulatory Visit: Payer: Commercial Managed Care - PPO | Admitting: Internal Medicine

## 2021-03-14 ENCOUNTER — Other Ambulatory Visit: Payer: Self-pay

## 2021-03-14 ENCOUNTER — Ambulatory Visit: Payer: Commercial Managed Care - PPO

## 2021-03-14 ENCOUNTER — Encounter: Payer: Self-pay | Admitting: Internal Medicine

## 2021-03-14 VITALS — BP 114/68 | HR 70 | Ht 62.0 in | Wt 156.0 lb

## 2021-03-14 DIAGNOSIS — Z1211 Encounter for screening for malignant neoplasm of colon: Secondary | ICD-10-CM | POA: Diagnosis not present

## 2021-03-14 DIAGNOSIS — K5909 Other constipation: Secondary | ICD-10-CM

## 2021-03-14 DIAGNOSIS — K5902 Outlet dysfunction constipation: Secondary | ICD-10-CM

## 2021-03-14 MED ORDER — SUTAB 1479-225-188 MG PO TABS: 1.0000 | ORAL_TABLET | ORAL | 0 refills | Status: DC

## 2021-03-14 NOTE — Progress Notes (Signed)
Denise Summers 50 y.o. 04-09-1971 [id number] Referred by: Sumner Boast MD  Assessment & Plan:   Encounter Diagnoses  Name Primary?  . Chronic constipation Yes  . 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 Colon cancer screening    I am going to refer her to pelvic floor physical therapy for what I think is dyssynergic defecation.  She may have some slow transit issues as well.  Unfortunately all pharmacologic agents for chronic constipation are either not on her formulary or a very high cost.  Motegrity is on her formulary but is $70 for a month or $140 for a 78-monthsupply.  Screening colonoscopy MiraLAX purge on preprep day and Sutab on prep day.  I will arrange follow-up after that.  Colonoscopy currently scheduled for May 31.  MiraLAX and bisacodyl regimen daily MiraLAX and 2-3 bisacodyl every 2-3 nights to help promote defecation at this time.  I appreciate the opportunity to care for this patient. CC: [place], IRosalyn Charters MD Dr. JSumner Boast Subjective:   Chief Complaint: Constipation  HPI The patient is a 50year old woman with chronic constipation for many years.  She reports that she can go up to a week without a bowel movement unless she takes a laxative and typically is using 3 bisacodyl tablets when she does this.  MiraLAX and a fiber supplement have not been helpful.  She does not have difficulty trying to defecate in public spaces when she has the need now but in the past she did have habits of fecal retention and toilet phobia outside of the house.  She denies significant stress urinary incontinence there is some urinary urgency.  She had a gynecology visit with Dr. JTalbert Nanand was referred after that.  Pelvic exam on that day was normal.  Magnesium p.o. recommended I think the patient misinterpreted and says she cannot really tolerate magnesium meaning citrate of magnesia. There is no rectal bleeding.  These bowel habit issues are chronic.  She did try a sample of Linzess  provided by her primary care provider she thinks it was the 72 mcg dose that worked for 2 or 3 days and then stopped.  Other associated symptoms are belching and bloating. No Known Allergies Current Meds  Medication Sig  . cholecalciferol (VITAMIN D) 1000 units tablet Take 1,000 Units by mouth daily.  . cyanocobalamin (,VITAMIN B-12,) 1000 MCG/ML injection Inject 1 mL into the skin every 30 (thirty) days.  . Multiple Vitamin (MULTIVITAMIN WITH MINERALS) TABS tablet Take 1 tablet by mouth daily.  . phenazopyridine (PYRIDIUM) 200 MG tablet Take 1 tablet (200 mg total) by mouth 3 (three) times daily as needed.  . Sodium Sulfate-Mag Sulfate-KCl (SUTAB) 1640-098-0450MG TABS Take 1 kit by mouth as directed.   Past Medical History:  Diagnosis Date  . Abnormal uterine bleeding (AUB) since march 2021  . 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 Constipation   . Depression   . GERD (gastroesophageal reflux disease)    silent reflux  . Obesity   . Other vitamin B12 deficiency anemias 11/29/2020   Past Surgical History:  Procedure Laterality Date  . ABDOMINOPLASTY    . AUGMENTATION MAMMAPLASTY     2019 and 2020  . BREAST BIOPSY Right    2017  . CHOLECYSTECTOMY    . DILATATION & CURETTAGE/HYSTEROSCOPY WITH MYOSURE N/A 05/09/2020   Procedure: DILATATION & CURETTAGE/HYSTEROSCOPY WITH MYOSURE;  Surgeon: JSalvadore Dom MD;  Location:  [facility];  Service: Gynecology;  Laterality: N/A;  . ENDOMETRIAL ABLATION    . ESOPHAGOGASTRODUODENOSCOPY (EGD) WITH PROPOFOL N/A 07/14/2014   Procedure: ESOPHAGOGASTRODUODENOSCOPY (EGD) WITH PROPOFOL;  Surgeon: Juanita Craver, MD;  Location: [facility];  Service: Endoscopy;  Laterality: N/A;  . LAPAROSCOPIC GASTRIC SLEEVE RESECTION WITH HIATAL HERNIA REPAIR N/A 10/30/2015   Procedure: LAPAROSCOPIC GASTRIC SLEEVE RESECTION WITH REPAIR OF HIATAL HERNIA REPAIR;  Surgeon: Excell Seltzer, MD;   Location: [facility];  Service: General;  Laterality: N/A;  . OPERATIVE ULTRASOUND N/A 05/09/2020   Procedure: OPERATIVE ULTRASOUND;  Surgeon: Salvadore Dom, MD;  Location: [facility];  Service: Gynecology;  Laterality: N/A;  . TUBAL LIGATION    . 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   Review of Systems See HPI complains of some vasomotor symptoms/night sweats all other review of systems are negative  Objective:   Physical Exam BP 114/68   Pulse 70   Ht 5' 2"  (1.575 m)   Wt 156 lb (70.8 kg)   BMI 28.53 kg/m  NAD WDWN Anicteric Lungs cta Cor NL s1s2 no rmg abd post-op flat - soft NT no organomegaly or mass or hernia  Patti Martinique, CMA present.   Anoderm inspection revealed no abnormalities Anal wink was absent Digital exam revealed normal resting tone and reduced voluntary squeeze. small rectocele present. Simulated defecation with valsalva revealed appropriate abdominal contraction and decreased relaxation and descent.   Skin is very tanned diffusely Normal mood and affect alert and oriented x3

## 2021-03-14 NOTE — Patient Instructions (Signed)
You have been scheduled for a colonoscopy. Please follow written instructions given to you at your visit today.  Please pick up your prep supplies at the pharmacy within the next 1-3 days. If you use inhalers (even only as needed), please bring them with you on the day of your procedure.  Do the bowel purge on Sunday May 29th prior to your colonoscopy.  Take a 17g dose of Miralax daily. Take over the counter Dulcolax as follows: 2-3 tablets every 2-3 days.   You will be contacted about setting up pelvic floor physical therapy.   I appreciate the opportunity to care for you. Stan Head, MD, [facility]

## 2021-05-09 ENCOUNTER — Ambulatory Visit: Payer: Commercial Managed Care - PPO | Admitting: Physical Therapy

## 2021-05-10 ENCOUNTER — Other Ambulatory Visit: Payer: Self-pay

## 2021-05-10 ENCOUNTER — Ambulatory Visit: Payer: Commercial Managed Care - PPO

## 2021-05-10 ENCOUNTER — Ambulatory Visit: Payer: Commercial Managed Care - PPO | Admitting: Podiatry

## 2021-05-10 DIAGNOSIS — M2041 Other hammer toe(s) (acquired), right foot: Secondary | ICD-10-CM

## 2021-05-10 DIAGNOSIS — B351 Tinea unguium: Secondary | ICD-10-CM

## 2021-05-10 DIAGNOSIS — M79674 Pain in right toe(s): Secondary | ICD-10-CM

## 2021-05-10 DIAGNOSIS — M2042 Other hammer toe(s) (acquired), left foot: Secondary | ICD-10-CM

## 2021-05-10 DIAGNOSIS — M79675 Pain in left toe(s): Secondary | ICD-10-CM

## 2021-05-10 NOTE — Patient Instructions (Signed)
https://orthoinfo.aaos.org/en/diseases--conditions/hammer-toe">  Hammer Toe Hammer toe is a change in the shape, or a deformity, of the toe. The deformity causes the middle joint of the toe to stay bent. Hammer toe starts gradually. At first, the toe can be straightened. Then over time, the toe deformity becomes stiff, inflexible, and permanently bent. Hammer toe usually affects the second, third, or fourth toe. A hammer toe causes pain, especially when wearing shoes. Corns and calluses can result from the toe rubbing against the inside of the shoe. Early treatments to keep the toe straight may relieve pain. As the deformity of the toe becomes stiff and permanent, surgery may be needed to straighten the toe. What are the causes? This condition is caused by abnormal bending of the toe joint that is closest to your foot. Over time, the toe bending downward pulls on the muscles and connections (tendons) of the toe joint, making them weak and stiff. Wearing shoes that are too narrow in the toe box and do not allow toes to fully straighten can cause this condition. What increases the risk? You are more likely to develop this condition if you:  Are an older female.  Wear shoes that are too small, or wear high-heeled shoes that pinch your toes.  Have a second toe that is longer than your big toe (first toe).  Injure your foot or toe.  Have arthritis, or have a nerve or muscle disorder.  Have diabetes or a condition known as Charcot joint, which may cause you to walk abnormally.  Have a family history of hammer toe.  Are a ballet dancer. What are the signs or symptoms? Pain and deformity of the toe are the main symptoms of this condition. The pain is worse when wearing shoes, walking, or running. Other symptoms may include:  A thickened patch of skin, called a corn or callus, that forms over the top of the bent part of the toe or between the toes.  Redness and a burning feeling on the bent  toe.  An open sore that forms on the top of the bent toe.  Not being able to straighten the affected toe.   How is this diagnosed? This condition is diagnosed based on your symptoms and a physical exam. During the exam, your health care provider will try to straighten your toe to see how stiff the deformity is. You may also have tests, such as:  A blood test to check for rheumatoid arthritis or diabetes.  An X-ray to show how severe the toe deformity is. How is this treated? Treatment for this condition depends on whether the toe is flexible or deformed and no longer moveable. In less severe cases, a hammer toe can be straightened without surgery. These treatments include:  Taping the toe into a straightened position.  Using pads and cushions to protect the bent toe.  Wearing shoes that provide enough room for the toes.  Doing toe-stretching exercises at home.  Taking an NSAID, such as ibuprofen, to reduce pain and swelling.  Using special orthotics or insoles for pain relief and to improve walking. If these treatments do not help or the toe has a severe deformity and cannot be straightened, surgery is the next option. The most common surgeries used to straighten a hammer toe include:  Arthroplasty or osteotomy. Part of the toe joint is reconstructed or removed, which allows the toe to straighten.  Fusion. Cartilage between the two bones of the joint is taken out, and the bones are fused together   into one longer bone.  Implantation. Part of the bone is removed and replaced with an implant to allow the toe to move again.  Flexor tendon transfer. The tendons that curl the toes down (flexor tendons) are repositioned. Follow these instructions at home:  Take over-the-counter and prescription medicines only as told by your health care provider.  Do toe-straightening and stretching exercises as told by your health care provider.  Keep all follow-up visits. This is important. How is  this prevented?  Wear shoes that fit properly and give your toes enough room. Shoes should not cause pain.  Buy shoes at the end of the day to make sure they fit well, since your foot may swell during the day. Make sure they are comfortable before you buy them.  As you age, your shoe size might change, including the width. Measure both feet and buy shoes for the larger foot.  A shoe repair store might be able to stretch shoes that feel tight in spots.  Do not wear high-heeled shoes or shoes with pointed toes. Contact a health care provider if:  Your pain gets worse.  Your toe becomes red or swollen.  You develop an open sore on your toe. Summary  Hammer toe is a condition that gradually causes your toe to become bent and stiff.  Hammer toe can be treated by taping the toe into a straightened position and doing toe-stretching exercises. If these treatments do not help, surgery may be needed.  To prevent this condition, wear shoes that fit properly, give your toes enough room, and do not cause pain. This information is not intended to replace advice given to you by your health care provider. Make sure you discuss any questions you have with your health care provider. Document Revised: 03/09/2020 Document Reviewed: 03/09/2020 Elsevier Patient Education  2021 Elsevier Inc.  

## 2021-05-13 NOTE — Progress Notes (Signed)
Subjective:   Patient ID: Denise Summers, female   DOB: 50 y.o.   MRN: [id number]   HPI 50 year old female presents the office today for concerns of her second toenails but started become thick and discolored.  She is having trouble trimming them.  She also feels that her toes curl under which aggravates her symptoms.  She states that she has family history of foot issues and she wants to try to prevent any issues from happening.  Denies any drainage or any swelling or redness to the toenail sites.  She has no other concerns.   Review of Systems  All other systems reviewed and are negative.  Past Medical History:  Diagnosis Date  . Abnormal uterine bleeding (AUB) since march 2021  . 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 Constipation   . Depression   . GERD (gastroesophageal reflux disease)    silent reflux  . Obesity   . Other vitamin B12 deficiency anemias 11/29/2020    Past Surgical History:  Procedure Laterality Date  . ABDOMINOPLASTY    . AUGMENTATION MAMMAPLASTY     2019 and 2020  . BREAST BIOPSY Right    2017  . CHOLECYSTECTOMY    . DILATATION & CURETTAGE/HYSTEROSCOPY WITH MYOSURE N/A 05/09/2020   Procedure: DILATATION & CURETTAGE/HYSTEROSCOPY WITH MYOSURE;  Surgeon: Salvadore Dom, MD;  Location: [facility];  Service: Gynecology;  Laterality: N/A;  . ENDOMETRIAL ABLATION    . ESOPHAGOGASTRODUODENOSCOPY (EGD) WITH PROPOFOL N/A 07/14/2014   Procedure: ESOPHAGOGASTRODUODENOSCOPY (EGD) WITH PROPOFOL;  Surgeon: Juanita Craver, MD;  Location: [facility];  Service: Endoscopy;  Laterality: N/A;  . LAPAROSCOPIC GASTRIC SLEEVE RESECTION WITH HIATAL HERNIA REPAIR N/A 10/30/2015   Procedure: LAPAROSCOPIC GASTRIC SLEEVE RESECTION WITH REPAIR OF HIATAL HERNIA REPAIR;  Surgeon: Excell Seltzer, MD;  Location: [facility];  Service: General;  Laterality: N/A;  . OPERATIVE ULTRASOUND N/A 05/09/2020   Procedure:  OPERATIVE ULTRASOUND;  Surgeon: Salvadore Dom, MD;  Location: [facility];  Service: Gynecology;  Laterality: N/A;  . TUBAL LIGATION    . UPPER GI ENDOSCOPY N/A 10/30/2015   Procedure: UPPER GI ENDOSCOPY;  Surgeon: Excell Seltzer, MD;  Location: [facility];  Service: General;  Laterality: N/A;     Current Outpatient Medications:  .  cholecalciferol (VITAMIN D) 1000 units tablet, Take 1,000 Units by mouth daily., Disp: , Rfl:  .  cyanocobalamin (,VITAMIN B-12,) 1000 MCG/ML injection, Inject 1 mL into the skin every 30 (thirty) days., Disp: , Rfl: 1 .  Multiple Vitamin (MULTIVITAMIN WITH MINERALS) TABS tablet, Take 1 tablet by mouth daily., Disp: , Rfl:  .  phenazopyridine (PYRIDIUM) 200 MG tablet, Take 1 tablet (200 mg total) by mouth 3 (three) times daily as needed., Disp: 6 tablet, Rfl: 0 .  Sodium Sulfate-Mag Sulfate-KCl (SUTAB) [id number] MG TABS, Take 1 kit by mouth as directed., Disp: 24 tablet, Rfl: 0  No Known Allergies       Objective:  Physical Exam  General: AAO x3, NAD  Dermatological: Bilateral second digit nails are mildly hypertrophic, dystrophic with brown discoloration.  There is no edema, erythema or drainage or any signs of infection.  No open lesions.  Vascular: Dorsalis Pedis artery and Posterior Tibial artery pedal pulses are 2/4 bilateral with immedate capillary fill time. There is no pain with calf compression, swelling, warmth, erythema.   Neruologic: Grossly intact via light touch bilateral.   Musculoskeletal: Digital contractures present  of the second digits along weightbearing evaluation.  Given the contractures causing pressure of the toenails.  Muscular strength 5/5 in all groups tested bilateral.  Gait: Unassisted, Nonantalgic.       Assessment:   Hammertoe deformity, onychodystrophy    Plan:  -Treatment options discussed including all alternatives, risks, and complications -Etiology of symptoms were discussed -Sharply  debrided second digit toenails without any complications or bleeding sent this for culture, pathology to [facility] labs distinguish onychodystrophy versus onychomycosis.  I dispensed toe crest to help offload the toes.  Consider possible surgical intervention to help with pressure for second digit toenails however already utilized this for failure of conservative treatment.  Trula Slade DPM

## 2021-05-15 ENCOUNTER — Other Ambulatory Visit: Payer: Self-pay

## 2021-05-15 ENCOUNTER — Encounter: Payer: Self-pay | Admitting: Internal Medicine

## 2021-05-15 ENCOUNTER — Ambulatory Visit (AMBULATORY_SURGERY_CENTER): Payer: Commercial Managed Care - PPO | Admitting: Internal Medicine

## 2021-05-15 VITALS — BP 121/75 | HR 60 | Temp 98.0°F | Resp 13 | Ht 62.0 in | Wt 156.0 lb

## 2021-05-15 DIAGNOSIS — Z1211 Encounter for screening for malignant neoplasm of colon: Secondary | ICD-10-CM

## 2021-05-15 DIAGNOSIS — K5902 Outlet dysfunction constipation: Secondary | ICD-10-CM

## 2021-05-15 DIAGNOSIS — K5909 Other constipation: Secondary | ICD-10-CM

## 2021-05-15 MED ORDER — SODIUM CHLORIDE 0.9 % IV SOLN: 500.0000 mL | Freq: Once | INTRAVENOUS | Status: DC

## 2021-05-15 NOTE — Patient Instructions (Addendum)
I am pleased to tell you that the colonoscopy was normal.  Please continue current treatment and schedule pelvic floor physical therapy if desired.  Next routine colonoscopy or other screening test in 10 years - 2032.  I appreciate the opportunity to care for you. Iva Boop, MD, [facility]      You may resume your current medications today. Repeat colonoscopy in 10 years for screening purposes. Please call if any questions or concerns.     YOU HAD AN ENDOSCOPIC PROCEDURE TODAY AT THE [facility]:   Refer to the procedure report that was given to you for any specific questions about what was found during the examination.  If the procedure report does not answer your questions, please call your gastroenterologist to clarify.  If you requested that your care partner not be given the details of your procedure findings, then the procedure report has been included in a sealed envelope for you to review at your convenience later.  YOU SHOULD EXPECT: Some feelings of bloating in the abdomen. Passage of more gas than usual.  Walking can help get rid of the air that was put into your GI tract during the procedure and reduce the bloating. If you had a lower endoscopy (such as a colonoscopy or flexible sigmoidoscopy) you may notice spotting of blood in your stool or on the toilet paper. If you underwent a bowel prep for your procedure, you may not have a normal bowel movement for a few days.  Please Note:  You might notice some irritation and congestion in your nose or some drainage.  This is from the oxygen used during your procedure.  There is no need for concern and it should clear up in a day or so.  SYMPTOMS TO REPORT IMMEDIATELY:   Following lower endoscopy (colonoscopy or flexible sigmoidoscopy):  Excessive amounts of blood in the stool  Significant tenderness or worsening of abdominal pains  Swelling of the abdomen that is new, acute  Fever of 100F or higher   For urgent  or emergent issues, a gastroenterologist can be reached at any hour by calling [phone]. Do not use MyChart messaging for urgent concerns.    DIET:  We do recommend a small meal at first, but then you may proceed to your regular diet.  Drink plenty of fluids but you should avoid alcoholic beverages for 24 hours.  ACTIVITY:  You should plan to take it easy for the rest of today and you should NOT DRIVE or use heavy machinery until tomorrow (because of the sedation medicines used during the test).    FOLLOW UP: Our staff will call the number listed on your records 48-72 hours following your procedure to check on you and address any questions or concerns that you may have regarding the information given to you following your procedure. If we do not reach you, we will leave a message.  We will attempt to reach you two times.  During this call, we will ask if you have developed any symptoms of COVID 19. If you develop any symptoms (ie: fever, flu-like symptoms, shortness of breath, cough etc.) before then, please call [phone].  If you test positive for Covid 19 in the 2 weeks post procedure, please call and report this information to [place].    If any biopsies were taken you will be contacted by phone or by letter within the next 1-3 weeks.  Please call us at [phone] if you have not heard about the  biopsies in 3 weeks.    SIGNATURES/CONFIDENTIALITY: You and/or your care partner have signed paperwork which will be entered into your electronic medical record.  These signatures attest to the fact that that the information above on your After Visit Summary has been reviewed and is understood.  Full responsibility of the confidentiality of this discharge information lies with you and/or your care-partner.

## 2021-05-15 NOTE — Progress Notes (Signed)
No problems noted in the recovery room. maw 

## 2021-05-15 NOTE — Op Note (Signed)
[facility] Patient Name: Denise Summers Procedure Date: 05/15/2021 1:24 PM MRN: [id number] Endoscopist: Iva Boop , MD Age: 50 Referring MD:  Date of Birth: 1971/09/15 Gender: Female Account #: [id number] Procedure:                Colonoscopy Indications:              Screening for colorectal malignant neoplasm Medicines:                Propofol per Anesthesia, Monitored Anesthesia Care Procedure:                Pre-Anesthesia Assessment:                           - Prior to the procedure, a History and Physical                            was performed, and patient medications and                            allergies were reviewed. The patient's tolerance of                            previous anesthesia was also reviewed. The risks                            and benefits of the procedure and the sedation                            options and risks were discussed with the patient.                            All questions were answered, and informed consent                            was obtained. Prior Anticoagulants: The patient has                            taken no previous anticoagulant or antiplatelet                            agents. ASA Grade Assessment: II - A patient with                            mild systemic disease. After reviewing the risks                            and benefits, the patient was deemed in                            satisfactory condition to undergo the procedure.                           After obtaining informed consent, the colonoscope  was passed under direct vision. Throughout the                            procedure, the patient's blood pressure, pulse, and                            oxygen saturations were monitored continuously. The                            Olympus PCF-H190DL (#[id number]) Colonoscope was                            introduced through the anus and advanced to the the                             cecum, identified by appendiceal orifice and                            ileocecal valve. The colonoscopy was performed                            without difficulty. The patient tolerated the                            procedure well. The quality of the bowel                            preparation was excellent. The ileocecal valve,                            appendiceal orifice, and rectum were photographed.                            The bowel preparation used was Sutab via split dose                            instruction. Scope In: 1:35:25 PM Scope Out: 1:46:34 PM Scope Withdrawal Time: 0 hours 7 minutes 1 second  Total Procedure Duration: 0 hours 11 minutes 9 seconds  Findings:                 The perianal and digital rectal examinations were                            normal.                           The entire examined colon appeared normal on direct                            and retroflexion views. Complications:            No immediate complications. Estimated Blood Loss:     Estimated blood loss: none. Impression:               - The entire examined colon is normal on direct  and                            retroflexion views.                           - No specimens collected. Recommendation:           - Patient has a contact number available for                            emergencies. The signs and symptoms of potential                            delayed complications were discussed with the                            patient. Return to normal activities tomorrow.                            Written discharge instructions were provided to the                            patient.                           - Resume previous diet.                           - Continue present medications.                           - Repeat colonoscopy or other appropriate test in                            10 years for screening purposes.                           - Continue current bowel  regimen - MiraLax and prn                            bisacodyl and see PT for pelvic floor Tx if desired Iva Boop, MD 05/15/2021 1:54:48 PM This report has been signed electronically.

## 2021-05-15 NOTE — Progress Notes (Signed)
pt tolerated well. VSS. awake and to recovery. Report given to RN.  

## 2021-05-17 ENCOUNTER — Telehealth: Payer: Self-pay

## 2021-05-17 ENCOUNTER — Telehealth: Payer: Self-pay | Admitting: *Deleted

## 2021-05-17 NOTE — Telephone Encounter (Signed)
  Follow up Call-  Call back number 05/15/2021  Post procedure Call Back phone  # [phone]  Permission to leave phone message Yes  Some recent data might be hidden     1st follow up call made.  NALM

## 2021-05-17 NOTE — Telephone Encounter (Signed)
  Follow up Call-  Call back number 05/15/2021  Post procedure Call Back phone  # [phone]  Permission to leave phone message Yes  Some recent data might be hidden     Patient questions:  Message left to call us if necessary.

## 2021-05-24 ENCOUNTER — Telehealth: Payer: Self-pay | Admitting: Podiatry

## 2021-05-24 NOTE — Telephone Encounter (Signed)
Pt called and stated she had some samples taken and sent to the lab. She wanted to follow up

## 2021-05-25 NOTE — Telephone Encounter (Signed)
Called [facility] today and spoke with the representative and was faxed over to [phone]. Denise Summers

## 2021-05-28 ENCOUNTER — Encounter: Payer: Self-pay | Admitting: Podiatry

## 2021-09-04 ENCOUNTER — Ambulatory Visit: Payer: Commercial Managed Care - PPO | Admitting: Podiatry

## 2021-10-02 ENCOUNTER — Ambulatory Visit (INDEPENDENT_AMBULATORY_CARE_PROVIDER_SITE_OTHER): Payer: Commercial Managed Care - PPO

## 2021-10-02 ENCOUNTER — Other Ambulatory Visit: Payer: Self-pay

## 2021-10-02 ENCOUNTER — Encounter: Payer: Self-pay | Admitting: Podiatry

## 2021-10-02 ENCOUNTER — Ambulatory Visit: Payer: Commercial Managed Care - PPO | Admitting: Podiatry

## 2021-10-02 DIAGNOSIS — M79674 Pain in right toe(s): Secondary | ICD-10-CM

## 2021-10-02 DIAGNOSIS — M2040 Other hammer toe(s) (acquired), unspecified foot: Secondary | ICD-10-CM

## 2021-10-02 DIAGNOSIS — M2041 Other hammer toe(s) (acquired), right foot: Secondary | ICD-10-CM | POA: Diagnosis not present

## 2021-10-02 DIAGNOSIS — M2042 Other hammer toe(s) (acquired), left foot: Secondary | ICD-10-CM

## 2021-10-02 DIAGNOSIS — M79675 Pain in left toe(s): Secondary | ICD-10-CM

## 2021-10-02 DIAGNOSIS — F5081 Binge eating disorder: Secondary | ICD-10-CM | POA: Insufficient documentation

## 2021-10-02 DIAGNOSIS — S6000XA Contusion of unspecified finger without damage to nail, initial encounter: Secondary | ICD-10-CM | POA: Insufficient documentation

## 2021-10-02 DIAGNOSIS — J029 Acute pharyngitis, unspecified: Secondary | ICD-10-CM | POA: Insufficient documentation

## 2021-10-02 NOTE — Patient Instructions (Signed)

## 2021-10-09 NOTE — Progress Notes (Signed)
Subjective: 50 year old female presents the office today for follow-up evaluation of pain to the second toes.  She has pain at the tip of the toes, hammertoes on the toenails.  She has tried shoe modification, offloading, padding without any resolution.  She wants to consider surgical intervention.  No fevers or chills.  No other concerns.  Objective: AAO x3, NAD DP/PT pulses palpable bilaterally, CRT less than 3 seconds Upon weightbearing evaluation there is digital contracture bilateral second toes with level of DIPJ.  This resulted in pain in the tip of the toes bilaterally.  The nails appear to be somewhat dystrophic, hypertrophic at the distal aspects.  No edema, erythema or signs of infection. 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  -X-rays obtained reviewed.  No evidence of acute fracture.  Digital contracture present. -Clinically nonweightbearing contractures at the level of DIPJ amputationsand the pressure of the tip of the toe causing toenail pain as well.  I discussed with her both conservative as well as surgical treatment options.  She is try conservative treatment without any significant resolution.  I discussed with her bilateral second digit DIPJ arthroplasty.  I discussed this is not a guarantee of resolution of the toenail pain but she wants to straighten toes up alleviate the pressure.  She understands the risks and wants to proceed. -The incision placement as well as the postoperative course was discussed with the patient. I discussed risks of the surgery which include, but not limited to, infection, bleeding, pain, swelling, need for further surgery, delayed or nonhealing, painful or ugly scar, numbness or sensation changes, over/under correction, recurrence, transfer lesions, further deformity,  DVT/PE, loss of toe/foot. Patient understands these risks and wishes to proceed with surgery. The surgical consent was reviewed with the patient all 3 pages were signed. No promises or guarantees were given to the outcome of the procedure. All questions were answered to the best of my ability. Before the surgery the patient was encouraged to call the office if there is any further questions. The surgery will be performed at the [facility] on an outpatient basis. -Patient encouraged to call the office with any questions, concerns, change in symptoms.

## 2021-10-11 DIAGNOSIS — M79676 Pain in unspecified toe(s): Secondary | ICD-10-CM

## 2021-10-12 ENCOUNTER — Telehealth: Payer: Self-pay | Admitting: Urology

## 2021-10-12 NOTE — Telephone Encounter (Signed)
DOS - 10/31/21  HAMMERTOE REPAIR 2ND BILAT --- [id number]  [facility] EFFECTIVE DATE - 12/16/2018  SPOKE WITH BREE M. WITH [facility] AND SHE STATED THAT FOR CPT CODE 62563 X'S 2 NO PRIOR AUTH IS REQUIRED  REF # [id number]

## 2021-10-31 ENCOUNTER — Other Ambulatory Visit: Payer: Self-pay | Admitting: Podiatry

## 2021-10-31 ENCOUNTER — Encounter: Payer: Self-pay | Admitting: Podiatry

## 2021-10-31 DIAGNOSIS — M2042 Other hammer toe(s) (acquired), left foot: Secondary | ICD-10-CM | POA: Diagnosis not present

## 2021-10-31 DIAGNOSIS — M2041 Other hammer toe(s) (acquired), right foot: Secondary | ICD-10-CM | POA: Diagnosis not present

## 2021-10-31 MED ORDER — PROMETHAZINE HCL 25 MG PO TABS: 25.0000 mg | ORAL_TABLET | Freq: Three times a day (TID) | ORAL | 0 refills | Status: DC | PRN

## 2021-10-31 MED ORDER — CEPHALEXIN 500 MG PO CAPS: 500.0000 mg | ORAL_CAPSULE | Freq: Three times a day (TID) | ORAL | 0 refills | Status: DC

## 2021-10-31 MED ORDER — HYDROCODONE-ACETAMINOPHEN 5-325 MG PO TABS: 1.0000 | ORAL_TABLET | Freq: Four times a day (QID) | ORAL | 0 refills | Status: DC | PRN

## 2021-10-31 NOTE — Progress Notes (Signed)
Postop medications sent 

## 2021-11-02 ENCOUNTER — Telehealth: Payer: Self-pay | Admitting: *Deleted

## 2021-11-02 NOTE — Telephone Encounter (Signed)
Called and left a message for the patient at 2:16 pm. Denise Summers

## 2021-11-02 NOTE — Telephone Encounter (Signed)
Called and left a message for the patient stating that I was calling to see how the patient was doing after having surgery with Dr Ardelle Anton on Wednesday and stated to call the office if any concerns or questions. Denise Summers

## 2021-11-02 NOTE — Telephone Encounter (Signed)
Patient called to speak with Misty Stanley. Please return call.

## 2021-11-05 ENCOUNTER — Ambulatory Visit (INDEPENDENT_AMBULATORY_CARE_PROVIDER_SITE_OTHER): Payer: Commercial Managed Care - PPO

## 2021-11-05 ENCOUNTER — Other Ambulatory Visit: Payer: Self-pay

## 2021-11-05 ENCOUNTER — Ambulatory Visit (INDEPENDENT_AMBULATORY_CARE_PROVIDER_SITE_OTHER): Payer: Commercial Managed Care - PPO | Admitting: Podiatry

## 2021-11-05 DIAGNOSIS — M2041 Other hammer toe(s) (acquired), right foot: Secondary | ICD-10-CM | POA: Diagnosis not present

## 2021-11-05 DIAGNOSIS — M2042 Other hammer toe(s) (acquired), left foot: Secondary | ICD-10-CM | POA: Diagnosis not present

## 2021-11-05 DIAGNOSIS — Z9889 Other specified postprocedural states: Secondary | ICD-10-CM

## 2021-11-11 NOTE — Progress Notes (Signed)
Subjective: Denise Summers is a 50 y.o. is seen today in office s/p bilateral second digit DIPJ arthroplasty preformed on 10/31/2021.  Having minimal discomfort.  She is doing in surgical shoes.  She has been icing elevating as well.  Denies any systemic complaints such as fevers, chills, nausea, vomiting. No calf pain, chest pain, shortness of breath.   Objective: General: No acute distress, AAOx3  DP/PT pulses palpable 2/4, CRT < 3 sec to all digits.  Protective sensation intact. Motor function intact.  Bilateral feet: Incision is well coapted without any evidence of dehiscence with sutures intact. There is no surrounding erythema, ascending cellulitis, fluctuance, crepitus, malodor, drainage/purulence. There is mild edema around the surgical site. There is no pain along the surgical site.  Toes are in rectus position. No other areas of tenderness to bilateral lower extremities.  No other open lesions or pre-ulcerative lesions.  No pain with calf compression, swelling, warmth, erythema.   Assessment and Plan:  Status post bilateral second digit DIPJ arthroplasties, doing well with no complications   -Treatment options discussed including all alternatives, risks, and complications -X-rays obtained and reviewed.  No evidence of acute fracture.  Status post arthroplasty of the DIPJ of the second digit. -Small amount of antibiotic ointment and a bandage applied.  Discussed that she can remove the bandages later this week and start to wash foot with soap and water and dry thoroughly and apply a similar bandage.  Do not soak the foot. -Ice/elevation -Pain medication as needed. -Monitor for any clinical signs or symptoms of infection and DVT/PE and directed to call the office immediately should any occur or go to the ER. -Follow-up as scheduled for possible suture removal or sooner if any problems arise. In the meantime, encouraged to call the office with any questions, concerns, change in  symptoms.   Ovid Curd, DPM

## 2021-11-15 ENCOUNTER — Encounter: Payer: Self-pay | Admitting: Student

## 2021-11-15 ENCOUNTER — Ambulatory Visit (INDEPENDENT_AMBULATORY_CARE_PROVIDER_SITE_OTHER): Payer: Commercial Managed Care - PPO | Admitting: Podiatry

## 2021-11-15 ENCOUNTER — Encounter: Payer: Self-pay | Admitting: Podiatry

## 2021-11-15 DIAGNOSIS — M2042 Other hammer toe(s) (acquired), left foot: Secondary | ICD-10-CM

## 2021-11-15 DIAGNOSIS — Z9889 Other specified postprocedural states: Secondary | ICD-10-CM

## 2021-11-15 DIAGNOSIS — M2041 Other hammer toe(s) (acquired), right foot: Secondary | ICD-10-CM

## 2021-11-18 NOTE — Progress Notes (Signed)
Subjective: Denise Summers is a 50 y.o. is seen today in office s/p bilateral second digit DIPJ arthroplasty preformed on 10/31/2021.  Presents today for suture removal.  No recent injury or changes.  She is still in surgical shoes.  She is asking return to work next week.  No fevers or chills or new concerns.     Objective: General: No acute distress, AAOx3  DP/PT pulses palpable 2/4, CRT < 3 sec to all digits.  Protective sensation intact. Motor function intact.  Bilateral feet: Incision is well coapted without any evidence of dehiscence with sutures intact. There is no surrounding erythema, ascending cellulitis, fluctuance, crepitus, malodor, drainage/purulence. There is mild edema around the surgical site. There is no pain along the surgical site.  Right second toe does curl some external improved compared to what it was prior to surgery. No other areas of tenderness to bilateral lower extremities.  No other open lesions or pre-ulcerative lesions.  No pain with calf compression, swelling, warmth, erythema.   Assessment and Plan:  Status post bilateral second digit DIPJ arthroplasties, doing well with no complications   -Treatment options discussed including all alternatives, risks, and complications -I remove the sutures today and complications.  She can start to wash it with soap and water and dry thoroughly.  Apply a small amount of antibiotic ointment and bandage. -Continue surgical shoe for now.  Dispensed a splint to help with the toes to help prevent any further curling. -Ice/elevation -Pain medication as needed. -Monitor for any clinical signs or symptoms of infection and DVT/PE and directed to call the office immediately should any occur or go to the ER. -Follow-up as scheduled or sooner if any problems arise. In the meantime, encouraged to call the office with any questions, concerns, change in symptoms.   Ovid Curd, DPM

## 2021-11-21 ENCOUNTER — Telehealth: Payer: Self-pay | Admitting: *Deleted

## 2021-11-21 NOTE — Telephone Encounter (Signed)
Patient is calling because procedural  toe is looking more red than usual but no drainage. Please advise.

## 2021-11-22 ENCOUNTER — Other Ambulatory Visit: Payer: Self-pay | Admitting: Podiatry

## 2021-11-22 MED ORDER — CEPHALEXIN 500 MG PO CAPS: 500.0000 mg | ORAL_CAPSULE | Freq: Three times a day (TID) | ORAL | 0 refills | Status: DC

## 2021-11-23 NOTE — Telephone Encounter (Signed)
Returned call to patient, no answer, left vmessage of physician's recommendations and that medication has been sent to pharmacy,for her to call back if there are any openings to incision.

## 2021-11-23 NOTE — Telephone Encounter (Signed)
Patient called and said someone called her today.  Returned call - she did get the antibiotic yesterday and has taken 2 doses today, there is redness and pain she is concerned with. Advised to continue to take the antibiotics and rest and if no better by Monday, then to call us, that she may need to be seen. Patient demonstrated verbal understanding.

## 2021-11-29 ENCOUNTER — Other Ambulatory Visit: Payer: Self-pay

## 2021-11-29 ENCOUNTER — Ambulatory Visit (INDEPENDENT_AMBULATORY_CARE_PROVIDER_SITE_OTHER): Payer: Commercial Managed Care - PPO | Admitting: Podiatry

## 2021-11-29 DIAGNOSIS — L539 Erythematous condition, unspecified: Secondary | ICD-10-CM

## 2021-11-29 DIAGNOSIS — M2041 Other hammer toe(s) (acquired), right foot: Secondary | ICD-10-CM

## 2021-11-29 DIAGNOSIS — Z9889 Other specified postprocedural states: Secondary | ICD-10-CM

## 2021-11-29 DIAGNOSIS — M2042 Other hammer toe(s) (acquired), left foot: Secondary | ICD-10-CM

## 2021-11-29 MED ORDER — MUPIROCIN 2 % EX OINT: 1.0000 "application " | TOPICAL_OINTMENT | Freq: Two times a day (BID) | CUTANEOUS | 2 refills | Status: DC

## 2021-12-02 NOTE — Progress Notes (Signed)
Subjective: Denise Summers is a 50 y.o. is seen today in office s/p bilateral second digit DIPJ arthroplasty preformed on 10/31/2021.  She states she is doing better.  She did call because she was having some redness and she started on cephalexin.  She has been tolerating this well but in side effects.  She has not seen any opening of the incision no drainage.  She states the toes feel better than he did prior to surgery as the tip of the toes not hitting and she walks.  Denies any fevers or chills.  No other concerns.   Objective: General: No acute distress, AAOx3  DP/PT pulses palpable 2/4, CRT < 3 sec to all digits.  Protective sensation intact. Motor function intact.  Bilateral feet: Incision is well coapted without any evidence of dehiscence and scar is formed on the right side.  On the left side small mount of scab is still present on the incision but healing well otherwise.  No drainage or pus.  There is mild edema and erythema back and this is more from inflammation as opposed to infection.  There is no warmth, ascending cellulitis.   No other open lesions or pre-ulcerative lesions.  No pain with calf compression, swelling, warmth, erythema.   Assessment and Plan:  Status post bilateral second digit DIPJ arthroplasties, resolving erythema  -Treatment options discussed including all alternatives, risks, and complications -Remain in surgical shoes.  Finish course of antibiotics..  Continue ice and elevate.  As the incisions continue to heal and her symptoms are improving she can transition to regular shoe as tolerated. -Monitor for any clinical signs or symptoms of infection and directed to call the office immediately should any occur or go to the ER.  Return in about 4 weeks (around 12/27/2021).  Vivi Barrack DPM

## 2021-12-20 NOTE — Progress Notes (Signed)
51 y.o. [id number] Single White or Caucasian Not Hispanic or Latino female here for annual exam.  H/O endometrial ablation. No vaginal bleeding. No vasomotor symptoms. Same partner, no dyspareunia.    She doesn't have a BM unless she takes a laxative. She takes miralax every day, she takes a laxative 2-3 x a week. She has seen GI, had a negative colonoscopy. She is bloated all the time.  H/O gastric sleeve. Gets labs with her primary.   No bladder c/o.  No LMP recorded. Patient has had an ablation.          Sexually active: Yes.    The current method of family planning is tubal ligation     Exercising: Yes.    Gym/ health club routine includes cardio. Smoker:  no  Health Maintenance: Pap:   11/24/19 WNL HR HPV Neg  04-16-16 neg HPV HR neg History of abnormal Pap:  no MMG:  02/08/21 density B Bi-rads 2 benign  BMD:   none  Colonoscopy: 05/15/21 normal, f/u 10 years TDaP:  up to date  Gardasil: none    reports that she has never smoked. She has never used smokeless tobacco. She reports that she does not currently use alcohol. She reports that she does not use drugs. Occasional ETOH. Works in the [employer], does patient training. Daughter is local, has 3 children (9, 3 and 1).   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, ok with recent surgeries   Constipation    Depression    GERD (gastroesophageal reflux disease)    silent reflux   Obesity    Other vitamin B12 deficiency anemias 11/29/2020    Past Surgical History:  Procedure Laterality Date   ABDOMINOPLASTY     AUGMENTATION MAMMAPLASTY     2019 and 2020   BREAST BIOPSY Right    2017   CHOLECYSTECTOMY     CORRECTION HAMMER TOE Bilateral    DILATATION & CURETTAGE/HYSTEROSCOPY WITH MYOSURE N/A 05/09/2020   Procedure: DILATATION & CURETTAGE/HYSTEROSCOPY WITH MYOSURE;  Surgeon: Romualdo Bolk, MD;  Location:  [facility];  Service: Gynecology;  Laterality: N/A;   ENDOMETRIAL ABLATION     ESOPHAGOGASTRODUODENOSCOPY (EGD) WITH PROPOFOL N/A 07/14/2014   Procedure: ESOPHAGOGASTRODUODENOSCOPY (EGD) WITH PROPOFOL;  Surgeon: Charna Elizabeth, MD;  Location: [facility];  Service: Endoscopy;  Laterality: N/A;   LAPAROSCOPIC GASTRIC SLEEVE RESECTION WITH HIATAL HERNIA REPAIR N/A 10/30/2015   Procedure: LAPAROSCOPIC GASTRIC SLEEVE RESECTION WITH REPAIR OF HIATAL HERNIA REPAIR;  Surgeon: Glenna Fellows, MD;  Location: [facility];  Service: General;  Laterality: N/A;   OPERATIVE ULTRASOUND N/A 05/09/2020   Procedure: OPERATIVE ULTRASOUND;  Surgeon: Romualdo Bolk, MD;  Location: [facility];  Service: Gynecology;  Laterality: N/A;   TUBAL LIGATION     UPPER GI ENDOSCOPY N/A 10/30/2015   Procedure: UPPER GI ENDOSCOPY;  Surgeon: Glenna Fellows, MD;  Location: [facility];  Service: General;  Laterality: N/A;    Current Outpatient Medications  Medication Sig Dispense Refill   ALPRAZolam (XANAX) 0.25 MG tablet 1 TABLET ORALLY TWICE A DAY AS NEEDED     Multiple Vitamin (MULTIVITAMIN WITH MINERALS) TABS tablet Take 1 tablet by mouth daily.     mupirocin ointment (BACTROBAN) 2 % Apply 1 application topically 2 (two) times daily. 30 g 2   promethazine (PHENERGAN) 25 MG tablet Take 1 tablet (25  mg total) by mouth every 8 (eight) hours as needed for nausea or vomiting. 20 tablet 0   zolpidem (AMBIEN) 10 MG tablet Take 10 mg by mouth at bedtime as needed.     cholecalciferol (VITAMIN D) 1000 units tablet Take 1,000 Units by mouth daily.     cyanocobalamin (,VITAMIN B-12,) 1000 MCG/ML injection Inject 1 mL into the skin every 30 (thirty) days.  1   No current facility-administered medications for this visit.    Family History  Problem Relation Age of Onset   Hypertension Mother    Heart attack Father    Heart disease Father    Diabetes Brother    Hypertension Brother    Heart disease  Brother    Heart attack Brother    Bone cancer Maternal Grandmother    Stroke Maternal Grandfather    Colon cancer Neg Hx    Esophageal cancer Neg Hx    Stomach cancer Neg Hx    Rectal cancer Neg Hx     Review of Systems  All other systems reviewed and are negative.  Exam:   BP 104/72    Pulse 64    Ht 5\' 2"  (1.575 m)    Wt 160 lb (72.6 kg)    SpO2 99%    BMI 29.26 kg/m   Weight change: @WEIGHTCHANGE @ Height:   Height: 5\' 2"  (157.5 cm)  Ht Readings from Last 3 Encounters:  12/26/21 5\' 2"  (1.575 m)  05/15/21 5\' 2"  (1.575 m)  03/14/21 5\' 2"  (1.575 m)    General appearance: alert, cooperative and appears stated age Head: Normocephalic, without obvious abnormality, atraumatic Neck: no adenopathy, supple, symmetrical, trachea midline and thyroid normal to inspection and palpation Lungs: clear to auscultation bilaterally Cardiovascular: regular rate and rhythm Breasts: normal appearance, no masses or tenderness, bilateral soft implants Abdomen: soft, non-tender; non distended,  no masses,  no organomegaly Extremities: extremities normal, atraumatic, no cyanosis or edema Skin: Skin color, texture, turgor normal. No rashes or lesions Lymph nodes: Cervical, supraclavicular, and axillary nodes normal. No abnormal inguinal nodes palpated Neurologic: Grossly normal   Pelvic: External genitalia:  no lesions              Urethra:  normal appearing urethra with no masses, tenderness or lesions              Bartholins and Skenes: normal                 Vagina: normal appearing vagina with normal color and discharge, no lesions              Cervix: no lesions               Bimanual Exam:  Uterus:   no masses or tenderness              Adnexa: no mass, fullness, tenderness               Rectovaginal: Confirms               Anus:  normal sphincter tone, no lesions  02/23/22 chaperoned for the exam.  1. Well woman exam Discussed breast self exam Discussed calcium and vit D  intake Labs with primary Mammogram due next month Colonoscopy UTD

## 2021-12-26 ENCOUNTER — Other Ambulatory Visit: Payer: Self-pay

## 2021-12-26 ENCOUNTER — Ambulatory Visit (INDEPENDENT_AMBULATORY_CARE_PROVIDER_SITE_OTHER): Payer: Commercial Managed Care - PPO | Admitting: Obstetrics and Gynecology

## 2021-12-26 ENCOUNTER — Ambulatory Visit: Payer: Commercial Managed Care - PPO | Admitting: Obstetrics and Gynecology

## 2021-12-26 ENCOUNTER — Other Ambulatory Visit: Payer: Self-pay | Admitting: Obstetrics and Gynecology

## 2021-12-26 ENCOUNTER — Encounter: Payer: Self-pay | Admitting: Obstetrics and Gynecology

## 2021-12-26 VITALS — BP 104/72 | HR 64 | Ht 62.0 in | Wt 160.0 lb

## 2021-12-26 DIAGNOSIS — Z1231 Encounter for screening mammogram for malignant neoplasm of breast: Secondary | ICD-10-CM

## 2021-12-26 DIAGNOSIS — Z01419 Encounter for gynecological examination (general) (routine) without abnormal findings: Secondary | ICD-10-CM

## 2021-12-26 NOTE — Patient Instructions (Addendum)
Try magnesium 500 mg a day for constipation.  EXERCISE   We recommended that you start or continue a regular exercise program for good health. Physical activity is anything that gets your body moving, some is better than none. The CDC recommends 150 minutes per week of Moderate-Intensity Aerobic Activity and 2 or more days of Muscle Strengthening Activity.  Benefits of exercise are limitless: helps weight loss/weight maintenance, improves mood and energy, helps with depression and anxiety, improves sleep, tones and strengthens muscles, improves balance, improves bone density, protects from chronic conditions such as heart disease, high blood pressure and diabetes and so much more. To learn more visit: https://www.cdc.gov/physicalactivity/index.html  DIET: Good nutrition starts with a healthy diet of fruits, vegetables, whole grains, and lean protein sources. Drink plenty of water for hydration. Minimize empty calories, sodium, sweets. For more information about dietary recommendations visit: https://health.gov/our-work/nutrition-physical-activity/dietary-guidelines and https://www.myplate.gov/  ALCOHOL:  Women should limit their alcohol intake to no more than 7 drinks/beers/glasses of wine (combined, not each!) per week. Moderation of alcohol intake to this level decreases your risk of breast cancer and liver damage.  If you are concerned that you may have a problem, or your friends have told you they are concerned about your drinking, there are many resources to help. A well-known program that is free, effective, and available to all people all over the nation is Alcoholics Anonymous.  Check out this site to learn more: https://www.aa.org/   CALCIUM AND VITAMIN D:  Adequate intake of calcium and Vitamin D are recommended for bone health.  You should be getting between 1000-1200 mg of calcium and 800 units of Vitamin D daily between diet and supplements  PAP SMEARS:  Pap smears, to check for cervical  cancer or precancers,  have traditionally been done yearly, scientific advances have shown that most women can have pap smears less often.  However, every woman still should have a physical exam from her gynecologist every year. It will include a breast check, inspection of the vulva and vagina to check for abnormal growths or skin changes, a visual exam of the cervix, and then an exam to evaluate the size and shape of the uterus and ovaries. We will also provide age appropriate advice regarding health maintenance, like when you should have certain vaccines, screening for sexually transmitted diseases, bone density testing, colonoscopy, mammograms, etc.   MAMMOGRAMS:  All women over 40 years old should have a routine mammogram.   COLON CANCER SCREENING: Now recommend starting at age 45. At this time colonoscopy is not covered for routine screening until 50. There are take home tests that can be done between 45-49.   COLONOSCOPY:  Colonoscopy to screen for colon cancer is recommended for all women at age 50.  We know, you hate the idea of the prep.  We agree, BUT, having colon cancer and not knowing it is worse!!  Colon cancer so often starts as a polyp that can be seen and removed at colonscopy, which can quite literally save your life!  And if your first colonoscopy is normal and you have no family history of colon cancer, most women don't have to have it again for 10 years.  Once every ten years, you can do something that may end up saving your life, right?  We will be happy to help you get it scheduled when you are ready.  Be sure to check your insurance coverage so you understand how much it will cost.  It may be covered as a   preventative service at no cost, but you should check your particular policy.      Breast Self-Awareness Breast self-awareness means being familiar with how your breasts look and feel. It involves checking your breasts regularly and reporting any changes to your health care  provider. Practicing breast self-awareness is important. A change in your breasts can be a sign of a serious medical problem. Being familiar with how your breasts look and feel allows you to find any problems early, when treatment is more likely to be successful. All women should practice breast self-awareness, including women who have had breast implants. How to do a breast self-exam One way to learn what is normal for your breasts and whether your breasts are changing is to do a breast self-exam. To do a breast self-exam: Look for Changes  Remove all the clothing above your waist. Stand in front of a mirror in a room with good lighting. Put your hands on your hips. Push your hands firmly downward. Compare your breasts in the mirror. Look for differences between them (asymmetry), such as: Differences in shape. Differences in size. Puckers, dips, and bumps in one breast and not the other. Look at each breast for changes in your skin, such as: Redness. Scaly areas. Look for changes in your nipples, such as: Discharge. Bleeding. Dimpling. Redness. A change in position. Feel for Changes Carefully feel your breasts for lumps and changes. It is best to do this while lying on your back on the floor and again while sitting or standing in the shower or tub with soapy water on your skin. Feel each breast in the following way: Place the arm on the side of the breast you are examining above your head. Feel your breast with the other hand. Start in the nipple area and make  inch (2 cm) overlapping circles to feel your breast. Use the pads of your three middle fingers to do this. Apply light pressure, then medium pressure, then firm pressure. The light pressure will allow you to feel the tissue closest to the skin. The medium pressure will allow you to feel the tissue that is a little deeper. The firm pressure will allow you to feel the tissue close to the ribs. Continue the overlapping circles,  moving downward over the breast until you feel your ribs below your breast. Move one finger-width toward the center of the body. Continue to use the  inch (2 cm) overlapping circles to feel your breast as you move slowly up toward your collarbone. Continue the up and down exam using all three pressures until you reach your armpit.  Write Down What You Find  Write down what is normal for each breast and any changes that you find. Keep a written record with breast changes or normal findings for each breast. By writing this information down, you do not need to depend only on memory for size, tenderness, or location. Write down where you are in your menstrual cycle, if you are still menstruating. If you are having trouble noticing differences in your breasts, do not get discouraged. With time you will become more familiar with the variations in your breasts and more comfortable with the exam. How often should I examine my breasts? Examine your breasts every month. If you are breastfeeding, the best time to examine your breasts is after a feeding or after using a breast pump. If you menstruate, the best time to examine your breasts is 5-7 days after your period is over. During your period,   your breasts are lumpier, and it may be more difficult to notice changes. When should I see my health care provider? See your health care provider if you notice: A change in shape or size of your breasts or nipples. A change in the skin of your breast or nipples, such as a reddened or scaly area. Unusual discharge from your nipples. A lump or thick area that was not there before. Pain in your breasts. Anything that concerns you.  

## 2021-12-27 ENCOUNTER — Encounter: Payer: Commercial Managed Care - PPO | Admitting: Podiatry

## 2022-01-25 ENCOUNTER — Ambulatory Visit
Admission: RE | Admit: 2022-01-25 | Discharge: 2022-01-25 | Disposition: A | Discharge status: Home / Self Care | Payer: Commercial Managed Care - PPO | Source: Ambulatory Visit | Attending: Obstetrics and Gynecology | Admitting: Obstetrics and Gynecology

## 2022-01-25 DIAGNOSIS — Z1231 Encounter for screening mammogram for malignant neoplasm of breast: Secondary | ICD-10-CM

## 2022-01-30 ENCOUNTER — Other Ambulatory Visit: Payer: Self-pay | Admitting: Internal Medicine

## 2022-01-30 ENCOUNTER — Other Ambulatory Visit: Payer: Self-pay

## 2022-01-30 ENCOUNTER — Ambulatory Visit
Admission: RE | Admit: 2022-01-30 | Discharge: 2022-01-30 | Disposition: A | Discharge status: Home / Self Care | Payer: Commercial Managed Care - PPO | Source: Ambulatory Visit | Attending: Internal Medicine | Admitting: Internal Medicine

## 2022-01-30 DIAGNOSIS — R519 Headache, unspecified: Secondary | ICD-10-CM

## 2022-10-31 IMAGING — MG DIGITAL SCREENING BREAST BILAT IMPLANT W/ TOMO W/ CAD
8 of 12 series · 8 of 28 positions shown · non-contrast
Comparison: Previous exam(s).

CLINICAL DATA: Screening.

EXAM:
DIGITAL SCREENING BILATERAL MAMMOGRAM WITH IMPLANTS, CAD AND
TOMOSYNTHESIS
TECHNIQUE: Bilateral screening digital craniocaudal and mediolateral oblique
mammograms were obtained. Bilateral screening digital breast
tomosynthesis was performed. The images were evaluated with
computer-aided detection. Standard and/or implant displaced views
were performed.

[R MLO]
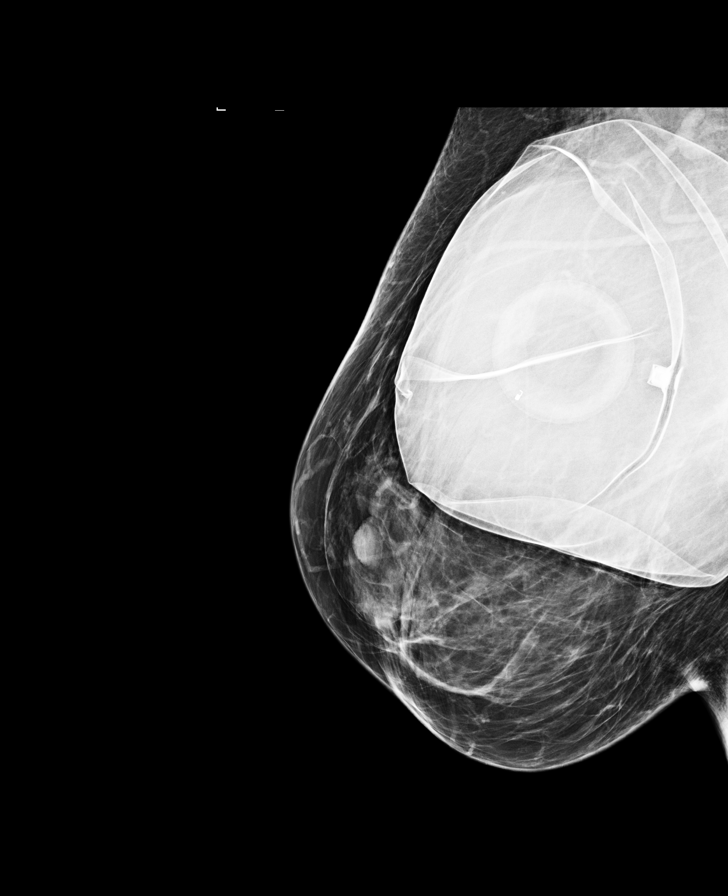

[L MLO]
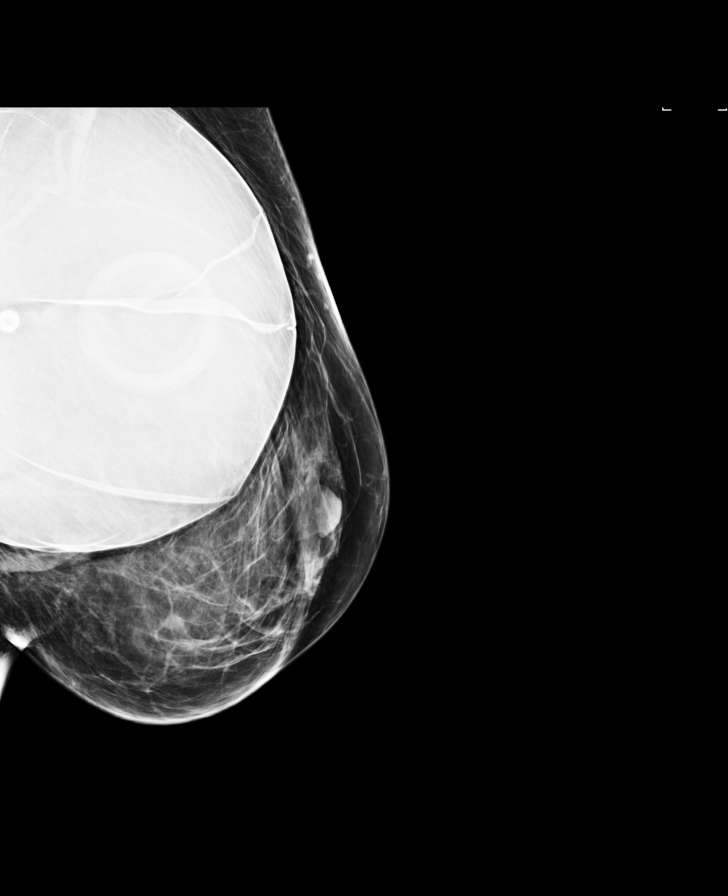

[L CC]
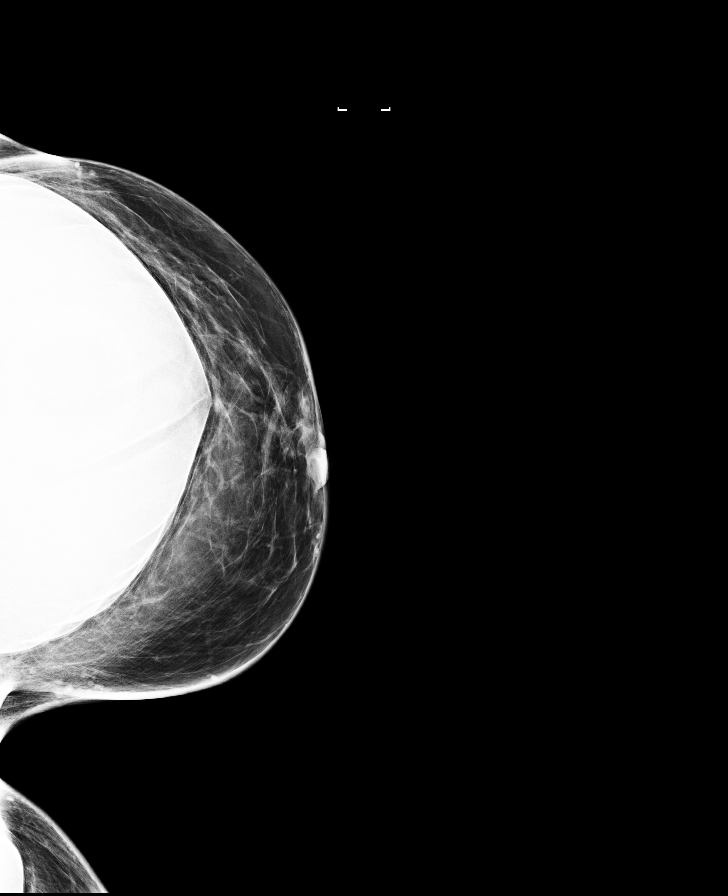

[R CC]
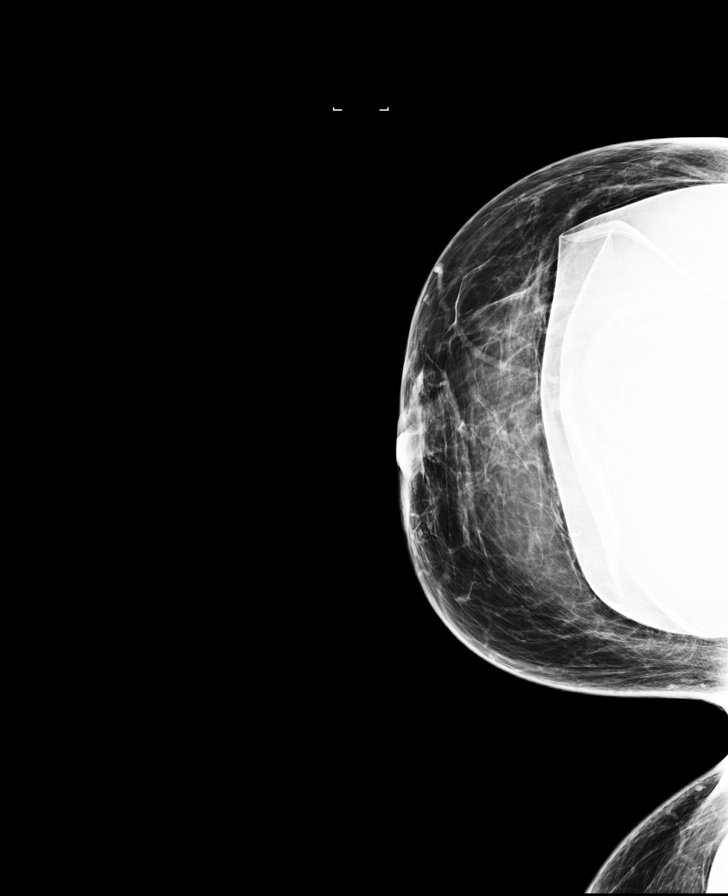

[R CC synth-2D]
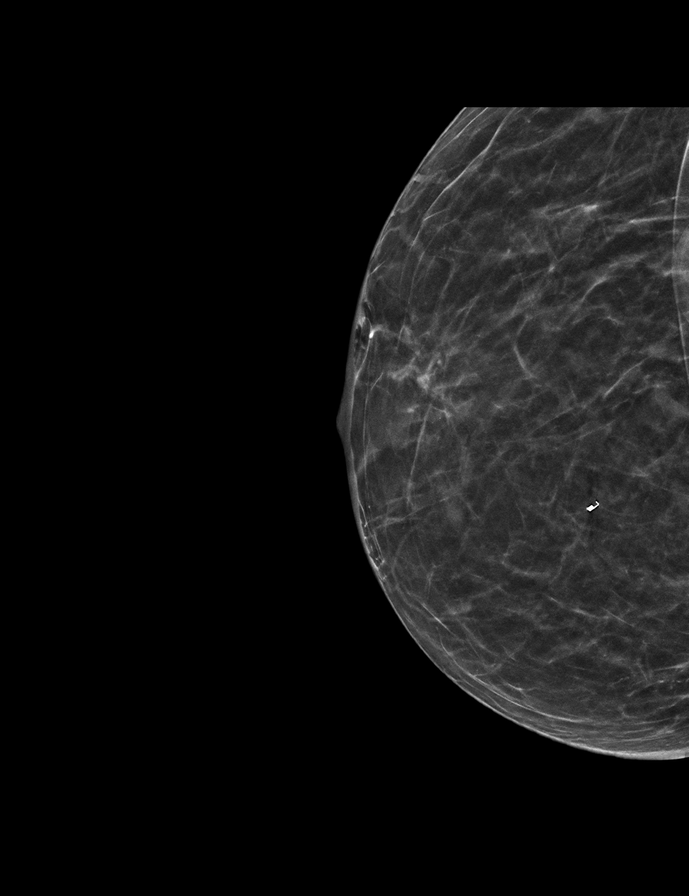

[L MLO synth-2D]
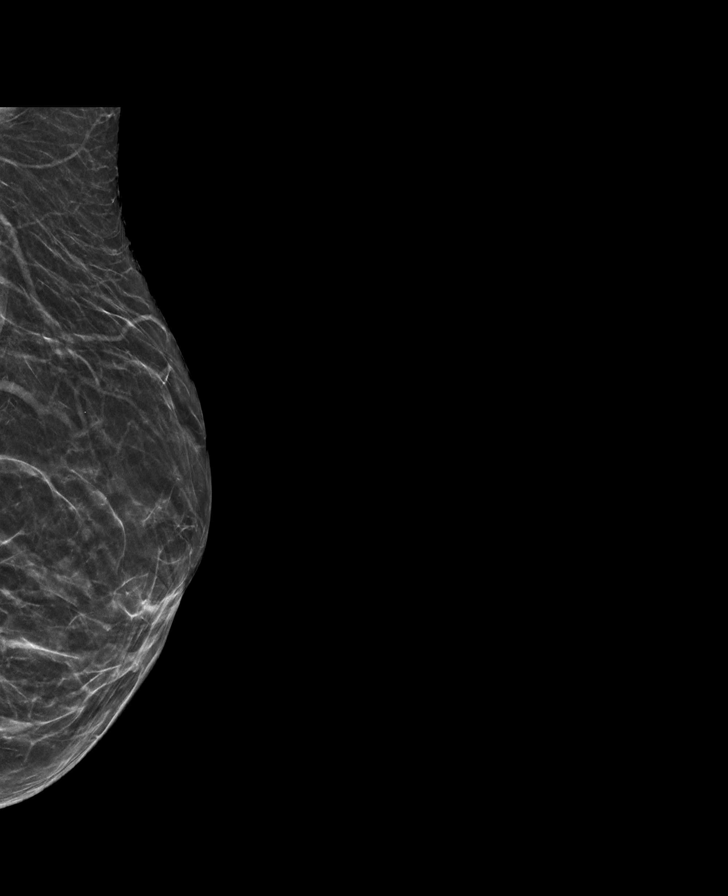

[R MLO synth-2D]
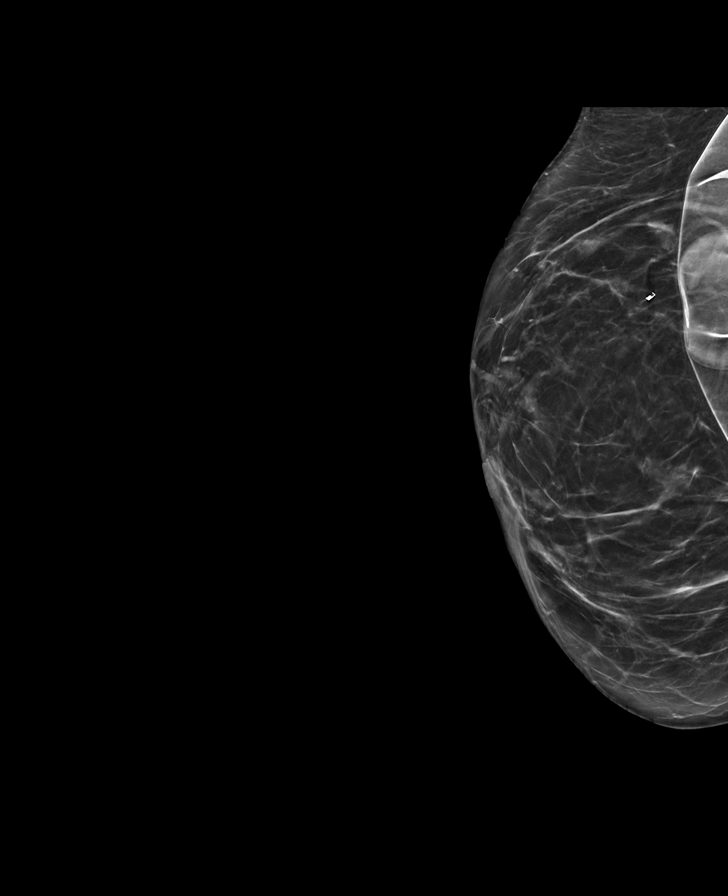

[L CC synth-2D]
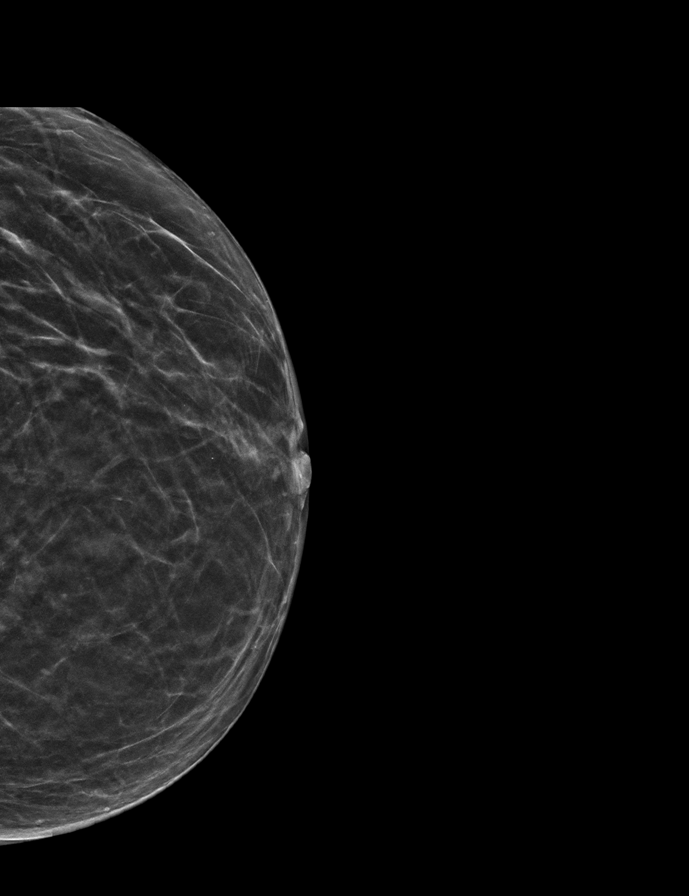

[8 of 28 positions shown; findings below may reference images not displayed]

ACR Breast Density Category b: There are scattered areas of
fibroglandular density.
FINDINGS: The patient has implants. There are no findings suspicious for
malignancy.
IMPRESSION: No mammographic evidence of malignancy. A result letter of this
screening mammogram will be mailed directly to the patient.

RECOMMENDATION:
Screening mammogram in one year. (Code:[id number])

BI-RADS CATEGORY  1:  Negative.
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
## 2022-12-25 ENCOUNTER — Other Ambulatory Visit: Payer: Self-pay | Admitting: Obstetrics and Gynecology

## 2022-12-25 DIAGNOSIS — Z1231 Encounter for screening mammogram for malignant neoplasm of breast: Secondary | ICD-10-CM

## 2023-02-10 NOTE — Progress Notes (Signed)
52 y.o. G33P1011 Single White or Caucasian Not Hispanic or Latino female here for annual exam.  H/O endometrial ablation. No vaginal bleeding. She has hot flashes, worse at night. No sweats. No help with OTC medication. Already avoiding triggers.  No vaginal dryness. Sexually active, same long partner, no pain.    Long term issues with constipation, has seen GI.   No bladder c/o.   No LMP recorded. Patient has had an ablation.          Sexually active: Yes.    The current method of family planning is tubal ligation.    Exercising: Yes.     Walking and home workouts  Smoker:  no  Health Maintenance: Pap: 11/24/19 WNL HR HPV; Neg  04-16-16 neg HPV HR neg History of abnormal Pap:  no MMG: 02/17/23 results pending.  BMD:   none Colonoscopy: 05/15/21 normal, f/u 10 years TDaP:  12/16/2013 Gardasil: none    reports that she has never smoked. She has never used smokeless tobacco. She reports that she does not currently use alcohol. She reports that she does not use drugs. Works in the [employer], does patient training. Daughter is local, has 3 children.   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, ok with recent surgeries   Constipation    Depression    GERD (gastroesophageal reflux disease)    silent reflux   Obesity    Other vitamin B12 deficiency anemias 11/29/2020    Past Surgical History:  Procedure Laterality Date   ABDOMINOPLASTY     AUGMENTATION MAMMAPLASTY     2019 and 2020   BREAST BIOPSY Right    2017   CHOLECYSTECTOMY     CORRECTION HAMMER TOE Bilateral    DILATATION & CURETTAGE/HYSTEROSCOPY WITH MYOSURE N/A 05/09/2020   Procedure: DILATATION & CURETTAGE/HYSTEROSCOPY WITH MYOSURE;  Surgeon: Salvadore Dom, MD;  Location: [facility];  Service: Gynecology;  Laterality: N/A;   ENDOMETRIAL ABLATION     ESOPHAGOGASTRODUODENOSCOPY (EGD) WITH  PROPOFOL N/A 07/14/2014   Procedure: ESOPHAGOGASTRODUODENOSCOPY (EGD) WITH PROPOFOL;  Surgeon: Juanita Craver, MD;  Location: [facility];  Service: Endoscopy;  Laterality: N/A;   LAPAROSCOPIC GASTRIC SLEEVE RESECTION WITH HIATAL HERNIA REPAIR N/A 10/30/2015   Procedure: LAPAROSCOPIC GASTRIC SLEEVE RESECTION WITH REPAIR OF HIATAL HERNIA REPAIR;  Surgeon: Excell Seltzer, MD;  Location: [facility];  Service: General;  Laterality: N/A;   OPERATIVE ULTRASOUND N/A 05/09/2020   Procedure: OPERATIVE ULTRASOUND;  Surgeon: Salvadore Dom, MD;  Location: [facility];  Service: Gynecology;  Laterality: N/A;   TUBAL LIGATION     UPPER GI ENDOSCOPY N/A 10/30/2015   Procedure: UPPER GI ENDOSCOPY;  Surgeon: Excell Seltzer, MD;  Location: [facility];  Service: General;  Laterality: N/A;    Current Outpatient Medications  Medication Sig Dispense Refill   cholecalciferol (VITAMIN D) 1000 units tablet Take 1,000 Units by mouth daily.     cyanocobalamin (,VITAMIN B-12,) 1000 MCG/ML injection Inject 1 mL into the skin every 30 (thirty) days.  1   Multiple Vitamin (MULTIVITAMIN WITH MINERALS) TABS tablet Take 1 tablet by mouth daily.     mupirocin ointment (BACTROBAN) 2 % Apply 1 application topically 2 (two) times daily. 30 g 2   pantoprazole (PROTONIX) 20 MG tablet Take 20 mg by mouth daily.     No current facility-administered medications for this visit.  Family History  Problem Relation Age of Onset   Hypertension Mother    Heart attack Father    Heart disease Father    Diabetes Brother    Hypertension Brother    Heart disease Brother    Heart attack Brother    Bone cancer Maternal Grandmother    Stroke Maternal Grandfather    Colon cancer Neg Hx    Esophageal cancer Neg Hx    Stomach cancer Neg Hx    Rectal cancer Neg Hx     Review of Systems  All other systems reviewed and are negative.   Exam:   BP 126/62   Pulse 68   Ht '5\' 2"'$  (1.575 m)   SpO2 100%   BMI 29.26  kg/m   Weight change: '@WEIGHTCHANGE'$ @ Height:   Height: '5\' 2"'$  (157.5 cm)  Ht Readings from Last 3 Encounters:  02/19/23 '5\' 2"'$  (1.575 m)  12/26/21 '5\' 2"'$  (1.575 m)  05/15/21 '5\' 2"'$  (1.575 m)    General appearance: alert, cooperative and appears stated age Head: Normocephalic, without obvious abnormality, atraumatic Neck: no adenopathy, supple, symmetrical, trachea midline and thyroid normal to inspection and palpation Lungs: clear to auscultation bilaterally Cardiovascular: regular rate and rhythm Breasts: normal appearance, no masses or tenderness Abdomen: soft, non-tender; non distended,  no masses,  no organomegaly Extremities: extremities normal, atraumatic, no cyanosis or edema Skin: Skin color, texture, turgor normal. No rashes or lesions Lymph nodes: Cervical, supraclavicular, and axillary nodes normal. No abnormal inguinal nodes palpated Neurologic: Grossly normal   Pelvic: External genitalia:  no lesions              Urethra:  normal appearing urethra with no masses, tenderness or lesions              Bartholins and Skenes: normal                 Vagina: mildly atrophic appearing vagina with normal color and discharge, no lesions              Cervix: no lesions               Bimanual Exam:  Uterus:  normal size, contour, position, consistency, mobility, non-tender              Adnexa: no mass, fullness, tenderness               Rectovaginal: Confirms               Anus:  normal sphincter tone, no lesions  Gae Dry, CMA chaperoned for the exam.  1. Well woman exam Discussed breast self exam Discussed calcium and vit D intake Mammogram done this week, results pending No pap this year Colonoscopy UTD Labs with primary  2. Vasomotor symptoms due to menopause Discussed behavioral changes Discussed the option of gabapentin at hs or HRT. Risks of HRT reviewed, information given She will reach out if she wants medication.

## 2023-02-17 ENCOUNTER — Ambulatory Visit
Admission: RE | Admit: 2023-02-17 | Discharge: 2023-02-17 | Disposition: A | Discharge status: Home / Self Care | Payer: Commercial Managed Care - PPO | Source: Ambulatory Visit | Attending: Obstetrics and Gynecology | Admitting: Obstetrics and Gynecology

## 2023-02-17 DIAGNOSIS — Z1231 Encounter for screening mammogram for malignant neoplasm of breast: Secondary | ICD-10-CM

## 2023-02-19 ENCOUNTER — Encounter: Payer: Self-pay | Admitting: Obstetrics and Gynecology

## 2023-02-19 ENCOUNTER — Ambulatory Visit (INDEPENDENT_AMBULATORY_CARE_PROVIDER_SITE_OTHER): Payer: Commercial Managed Care - PPO | Admitting: Obstetrics and Gynecology

## 2023-02-19 VITALS — BP 126/62 | HR 68 | Ht 62.0 in

## 2023-02-19 DIAGNOSIS — N951 Menopausal and female climacteric states: Secondary | ICD-10-CM | POA: Diagnosis not present

## 2023-02-19 DIAGNOSIS — Z01419 Encounter for gynecological examination (general) (routine) without abnormal findings: Secondary | ICD-10-CM | POA: Diagnosis not present

## 2023-02-19 NOTE — Patient Instructions (Addendum)
EXERCISE   We recommended that you start or continue a regular exercise program for good health. Physical activity is anything that gets your body moving, some is better than none. The CDC recommends 150 minutes per week of Moderate-Intensity Aerobic Activity and 2 or more days of Muscle Strengthening Activity.  Benefits of exercise are limitless: helps weight loss/weight maintenance, improves mood and energy, helps with depression and anxiety, improves sleep, tones and strengthens muscles, improves balance, improves bone density, protects from chronic conditions such as heart disease, high blood pressure and diabetes and so much more. To learn more visit: https://www.cdc.gov/physicalactivity/index.html  DIET: Good nutrition starts with a healthy diet of fruits, vegetables, whole grains, and lean protein sources. Drink plenty of water for hydration. Minimize empty calories, sodium, sweets. For more information about dietary recommendations visit: https://health.gov/our-work/nutrition-physical-activity/dietary-guidelines and https://www.myplate.gov/  ALCOHOL:  Women should limit their alcohol intake to no more than 7 drinks/beers/glasses of wine (combined, not each!) per week. Moderation of alcohol intake to this level decreases your risk of breast cancer and liver damage.  If you are concerned that you may have a problem, or your friends have told you they are concerned about your drinking, there are many resources to help. A well-known program that is free, effective, and available to all people all over the nation is Alcoholics Anonymous.  Check out this site to learn more: https://www.aa.org/   CALCIUM AND VITAMIN D:  Adequate intake of calcium and Vitamin D are recommended for bone health.  You should be getting between 1000-1200 mg of calcium and 800 units of Vitamin D daily between diet and supplements  PAP SMEARS:  Pap smears, to check for cervical cancer or precancers,  have traditionally been  done yearly, scientific advances have shown that most women can have pap smears less often.  However, every woman still should have a physical exam from her gynecologist every year. It will include a breast check, inspection of the vulva and vagina to check for abnormal growths or skin changes, a visual exam of the cervix, and then an exam to evaluate the size and shape of the uterus and ovaries. We will also provide age appropriate advice regarding health maintenance, like when you should have certain vaccines, screening for sexually transmitted diseases, bone density testing, colonoscopy, mammograms, etc.   MAMMOGRAMS:  All women over 40 years old should have a routine mammogram.   COLON CANCER SCREENING: Now recommend starting at age 45. At this time colonoscopy is not covered for routine screening until 50. There are take home tests that can be done between 45-49.   COLONOSCOPY:  Colonoscopy to screen for colon cancer is recommended for all women at age 50.  We know, you hate the idea of the prep.  We agree, BUT, having colon cancer and not knowing it is worse!!  Colon cancer so often starts as a polyp that can be seen and removed at colonscopy, which can quite literally save your life!  And if your first colonoscopy is normal and you have no family history of colon cancer, most women don't have to have it again for 10 years.  Once every ten years, you can do something that may end up saving your life, right?  We will be happy to help you get it scheduled when you are ready.  Be sure to check your insurance coverage so you understand how much it will cost.  It may be covered as a preventative service at no cost, but you should check   your particular policy.      Breast Self-Awareness Breast self-awareness means being familiar with how your breasts look and feel. It involves checking your breasts regularly and reporting any changes to your health care provider. Practicing breast self-awareness is  important. A change in your breasts can be a sign of a serious medical problem. Being familiar with how your breasts look and feel allows you to find any problems early, when treatment is more likely to be successful. All women should practice breast self-awareness, including women who have had breast implants. How to do a breast self-exam One way to learn what is normal for your breasts and whether your breasts are changing is to do a breast self-exam. To do a breast self-exam: Look for Changes  Remove all the clothing above your waist. Stand in front of a mirror in a room with good lighting. Put your hands on your hips. Push your hands firmly downward. Compare your breasts in the mirror. Look for differences between them (asymmetry), such as: Differences in shape. Differences in size. Puckers, dips, and bumps in one breast and not the other. Look at each breast for changes in your skin, such as: Redness. Scaly areas. Look for changes in your nipples, such as: Discharge. Bleeding. Dimpling. Redness. A change in position. Feel for Changes Carefully feel your breasts for lumps and changes. It is best to do this while lying on your back on the floor and again while sitting or standing in the shower or tub with soapy water on your skin. Feel each breast in the following way: Place the arm on the side of the breast you are examining above your head. Feel your breast with the other hand. Start in the nipple area and make  inch (2 cm) overlapping circles to feel your breast. Use the pads of your three middle fingers to do this. Apply light pressure, then medium pressure, then firm pressure. The light pressure will allow you to feel the tissue closest to the skin. The medium pressure will allow you to feel the tissue that is a little deeper. The firm pressure will allow you to feel the tissue close to the ribs. Continue the overlapping circles, moving downward over the breast until you feel your  ribs below your breast. Move one finger-width toward the center of the body. Continue to use the  inch (2 cm) overlapping circles to feel your breast as you move slowly up toward your collarbone. Continue the up and down exam using all three pressures until you reach your armpit.  Write Down What You Find  Write down what is normal for each breast and any changes that you find. Keep a written record with breast changes or normal findings for each breast. By writing this information down, you do not need to depend only on memory for size, tenderness, or location. Write down where you are in your menstrual cycle, if you are still menstruating. If you are having trouble noticing differences in your breasts, do not get discouraged. With time you will become more familiar with the variations in your breasts and more comfortable with the exam. How often should I examine my breasts? Examine your breasts every month. If you are breastfeeding, the best time to examine your breasts is after a feeding or after using a breast pump. If you menstruate, the best time to examine your breasts is 5-7 days after your period is over. During your period, your breasts are lumpier, and it may be more   difficult to notice changes. When should I see my health care provider? See your health care provider if you notice: A change in shape or size of your breasts or nipples. A change in the skin of your breast or nipples, such as a reddened or scaly area. Unusual discharge from your nipples. A lump or thick area that was not there before. Pain in your breasts. Anything that concerns you. Menopause and Hormone Replacement Therapy Menopause is a normal time of life when menstrual periods stop completely and the ovaries stop producing the female hormones estrogen and progesterone. Low levels of these hormones can affect your health and cause symptoms. Hormone replacement therapy (HRT) can relieve some of those symptoms. HRT is  the use of artificial (synthetic) hormones to replace hormones that your body has stopped producing because you have reached menopause. Types of HRT  HRT may consist of the synthetic hormones estrogen and progestin, or it may consist of estrogen-only therapy. You and your health care provider will decide which form of HRT is best for you. If you choose to be on HRT and you have a uterus, estrogen and progestin are usually prescribed. Estrogen-only therapy is used for women who do not have a uterus. Possible options for taking HRT include: Pills. Patches. Gels. Sprays. Vaginal cream. Vaginal rings. Vaginal inserts. The amount of hormones that you take and how long you take them varies according to your health. It is important to: Begin HRT with the lowest possible dosage. Stop HRT as soon as your health care provider tells you to stop. Work with your health care provider so that you feel informed and comfortable with your decisions. Tell a health care provider about: Any allergies you have. Whether you have had blood clots or know of any risk factors you may have for blood clots. Whether you or family members have had cancer, especially cancer of the breasts, ovaries, or uterus. Any surgeries you have had. All medicines you are taking, including vitamins, herbs, eye drops, creams, and over-the-counter medicines. Whether you are pregnant or may be pregnant. Any medical conditions you have. What are the benefits? HRT can reduce the frequency and severity of menopausal symptoms. Benefits of HRT vary according to the kind of symptoms that you have, how severe they are, and your overall health. HRT may help to improve the following symptoms of menopause: Hot flashes and night sweats. These are sudden feelings of heat that spread over the face and body. The skin may turn red, like a blush. Night sweats are hot flashes that happen while you are sleeping or trying to sleep. Bone loss  (osteoporosis). The body loses calcium more quickly after menopause, causing the bones to become weaker. This can increase the risk for bone breaks (fractures). Vaginal dryness. The lining of the vagina can become thin and dry, which can cause pain during sex or cause infection, burning, or itching. Urinary tract infections. Urinary incontinence. This is the inability to control when you urinate. Irritability. Short-term memory problems. What are the risks? Risks of HRT vary depending on your individual health and medical history. Risks of HRT also depend on whether you receive both estrogen and progestin or you receive estrogen only. HRT may increase the risk of: Spotting. This is when a small amount of blood leaks from the vagina unexpectedly. Endometrial cancer. This cancer is in the lining of the uterus (endometrium). Breast cancer. Increased density of breast tissue. This can make it harder to find breast cancer on a   breast X-ray (mammogram). Stroke. Heart disease. Blood clots. Gallbladder disease or liver disease. Risks of HRT can increase if you have any of the following conditions: Endometrial cancer. Liver disease. Heart disease. Breast cancer. History of blood clots. History of stroke. Follow these instructions at home: Pap tests Have Pap tests done as often as told by your health care provider. A Pap test is sometimes called a Pap smear. It is a screening test that is used to check for signs of cancer of the cervix and vagina. A Pap test can also identify the presence of infection or precancerous changes. Pap tests may be done: Every 3 years, starting at age 21. Every 5 years, starting after age 30, in combination with testing for human papillomavirus (HPV). More often or less often depending on other medical conditions you have, your age, and other risk factors. It is up to you to get the results of your Pap test. Ask your health care provider, or the department that is doing  the test, when your results will be ready. General instructions Take over-the-counter and prescription medicines only as told by your health care provider. Do not use any products that contain nicotine or tobacco. These products include cigarettes, chewing tobacco, and vaping devices, such as e-cigarettes. If you need help quitting, ask your health care provider. Get mammograms, pelvic exams, and medical checkups as often as told by your health care provider. Keep all follow-up visits. This is important. Contact a health care provider if you have: Pain or swelling in your legs. Lumps or changes in your breasts or armpits. Pain, burning, or bleeding when you urinate. Unusual vaginal bleeding. Dizziness or headaches. Pain in your abdomen. Get help right away if you have: Shortness of breath. Chest pain. Slurred speech. Weakness or numbness in any part of your arms or legs. These symptoms may represent a serious problem that is an emergency. Do not wait to see if the symptoms will go away. Get medical help right away. Call your local emergency services (911 in the [place] Do not drive yourself to the hospital. Summary Menopause is a normal time of life when menstrual periods stop completely and the ovaries stop producing the female hormones estrogen and progesterone. HRT can reduce the frequency and severity of menopausal symptoms. Risks of HRT vary depending on your individual health and medical history. This information is not intended to replace advice given to you by your health care provider. Make sure you discuss any questions you have with your health care provider. Document Revised: 06/05/2020 Document Reviewed: 06/05/2020 [employer] Patient Education  2023 [employer]  

## 2024-01-06 ENCOUNTER — Other Ambulatory Visit: Payer: Self-pay | Admitting: Obstetrics and Gynecology

## 2024-01-06 DIAGNOSIS — Z1231 Encounter for screening mammogram for malignant neoplasm of breast: Secondary | ICD-10-CM

## 2024-01-08 ENCOUNTER — Other Ambulatory Visit: Payer: Self-pay | Admitting: Obstetrics and Gynecology

## 2024-01-08 DIAGNOSIS — Z1231 Encounter for screening mammogram for malignant neoplasm of breast: Secondary | ICD-10-CM

## 2024-02-24 ENCOUNTER — Ambulatory Visit (INDEPENDENT_AMBULATORY_CARE_PROVIDER_SITE_OTHER): Admitting: Obstetrics and Gynecology

## 2024-02-24 ENCOUNTER — Ambulatory Visit: Payer: Commercial Managed Care - PPO | Admitting: Obstetrics and Gynecology

## 2024-02-24 ENCOUNTER — Other Ambulatory Visit (HOSPITAL_COMMUNITY)
Admission: RE | Admit: 2024-02-24 | Discharge: 2024-02-24 | Disposition: A | Discharge status: Home / Self Care | Source: Ambulatory Visit | Attending: Obstetrics and Gynecology | Admitting: Obstetrics and Gynecology

## 2024-02-24 ENCOUNTER — Encounter: Payer: Self-pay | Admitting: Obstetrics and Gynecology

## 2024-02-24 VITALS — BP 116/78 | Resp 16 | Ht 62.0 in

## 2024-02-24 DIAGNOSIS — E2839 Other primary ovarian failure: Secondary | ICD-10-CM

## 2024-02-24 DIAGNOSIS — N898 Other specified noninflammatory disorders of vagina: Secondary | ICD-10-CM

## 2024-02-24 DIAGNOSIS — Z01419 Encounter for gynecological examination (general) (routine) without abnormal findings: Secondary | ICD-10-CM

## 2024-02-24 DIAGNOSIS — Z1331 Encounter for screening for depression: Secondary | ICD-10-CM | POA: Diagnosis not present

## 2024-02-24 NOTE — Progress Notes (Signed)
 53 y.o. y.o. female here for annual exam. No LMP recorded. Patient has had an ablation.   [id number] Single White or Caucasian Not Hispanic or Latino female here for annual exam.  H/O endometrial ablation. No vaginal bleeding. She has hot flashes, worse at night. No sweats. No help with OTC medication. Already avoiding triggers.  No vaginal dryness. Sexually active, same long partner, no pain.    Long term issues with constipation, has seen GI.   No bladder c/o.   No LMP recorded. Patient has had an ablation.          Sexually active: Yes.    The current method of family planning is tubal ligation.    Exercising: Yes.    Walking and home workouts  Smoker:  no  Health Maintenance: Pap: 11/24/19 WNL HR HPV; Neg  04-16-16 neg HPV HR neg History of abnormal Pap:  no MMG: 02/17/23 results pending.  BMD:  To get baseline history of knee fracture in 2017. Has family history of osteoporosis as well. Colonoscopy: 05/15/21 normal, f/u 10 years TDaP:  12/16/2013 Gardasil: none  Tore meniscus. Has been on steroids Reports vaginal odor.  Body mass index is 29.26 kg/m.     02/24/2024    2:53 PM 12/26/2015   11:41 AM 11/14/2015    5:09 PM  Depression screen PHQ 2/9  Decreased Interest 0 0 0  Down, Depressed, Hopeless 0 0 0  PHQ - 2 Score 0 0 0    Blood pressure 116/78, resp. rate 16, height 5\' 2"  (1.575 m), SpO2 97%.     Component Value Date/Time   DIAGPAP  11/24/2019 1228    - Negative for Intraepithelial Lesions or Malignancy (NILM)   DIAGPAP - Benign reactive/reparative changes 11/24/2019 1228   HPVHIGH Negative 11/24/2019 1228   ADEQPAP  11/24/2019 1228    Satisfactory for evaluation; transformation zone component PRESENT.    GYN HISTORY:    Component Value Date/Time   DIAGPAP  11/24/2019 1228    - Negative for Intraepithelial Lesions or Malignancy (NILM)   DIAGPAP - Benign reactive/reparative changes 11/24/2019 1228   HPVHIGH Negative 11/24/2019 1228   ADEQPAP  11/24/2019  1228    Satisfactory for evaluation; transformation zone component PRESENT.    OB History  Gravida Para Term Preterm AB Living  2 1 1  1 1   SAB IAB Ectopic Multiple Live Births  1    1    # Outcome Date GA Lbr Len/2nd Weight Sex Type Anes PTL Lv  2 Term           1 SAB             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, ok with recent surgeries   Constipation    Depression    GERD (gastroesophageal reflux disease)    silent reflux   Obesity    Other vitamin B12 deficiency anemias 11/29/2020    Past Surgical History:  Procedure Laterality Date   ABDOMINOPLASTY     AUGMENTATION MAMMAPLASTY     2019 and 2020   BREAST BIOPSY Right    2017   CHOLECYSTECTOMY     CORRECTION HAMMER TOE Bilateral    DILATATION & CURETTAGE/HYSTEROSCOPY WITH MYOSURE N/A 05/09/2020   Procedure: DILATATION & CURETTAGE/HYSTEROSCOPY WITH MYOSURE;  Surgeon: Romualdo Bolk, MD;  Location: [facility] [place];  Service: Gynecology;  Laterality: N/A;   ENDOMETRIAL ABLATION     ESOPHAGOGASTRODUODENOSCOPY (EGD) WITH PROPOFOL N/A 07/14/2014   Procedure: ESOPHAGOGASTRODUODENOSCOPY (EGD) WITH PROPOFOL;  Surgeon: Charna Larry Alcock, MD;  Location: [facility];  Service: Endoscopy;  Laterality: N/A;   LAPAROSCOPIC GASTRIC SLEEVE RESECTION WITH HIATAL HERNIA REPAIR N/A 10/30/2015   Procedure: LAPAROSCOPIC GASTRIC SLEEVE RESECTION WITH REPAIR OF HIATAL HERNIA REPAIR;  Surgeon: Glenna Fellows, MD;  Location: [facility];  Service: General;  Laterality: N/A;   OPERATIVE ULTRASOUND N/A 05/09/2020   Procedure: OPERATIVE ULTRASOUND;  Surgeon: Romualdo Bolk, MD;  Location: [facility];  Service: Gynecology;  Laterality: N/A;   TUBAL LIGATION     UPPER GI ENDOSCOPY N/A 10/30/2015   Procedure: UPPER GI ENDOSCOPY;  Surgeon: Glenna Fellows, MD;  Location: [facility];  Service:  General;  Laterality: N/A;    Current Outpatient Medications on File Prior to Visit  Medication Sig Dispense Refill   ALPRAZolam (XANAX) 0.25 MG tablet Take 0.25 mg by mouth 2 (two) times daily as needed.     cholecalciferol (VITAMIN D) 1000 units tablet Take 1,000 Units by mouth daily.     cyanocobalamin (,VITAMIN B-12,) 1000 MCG/ML injection Inject 1 mL into the skin every 30 (thirty) days.  1   Multiple Vitamin (MULTIVITAMIN WITH MINERALS) TABS tablet Take 1 tablet by mouth daily.     mupirocin ointment (BACTROBAN) 2 % Apply 1 application topically 2 (two) times daily. 30 g 2   pantoprazole (PROTONIX) 40 MG tablet Take 40 mg by mouth daily.     No current facility-administered medications on file prior to visit.    Social History   Socioeconomic History   Marital status: Married    Spouse name: Not on file   Number of children: Not on file   Years of education: Not on file   Highest education level: Not on file  Occupational History   Not on file  Tobacco Use   Smoking status: Never   Smokeless tobacco: Never  Vaping Use   Vaping status: Never Used  Substance and Sexual Activity   Alcohol use: Not Currently   Drug use: No   Sexual activity: Yes    Partners: Male    Birth control/protection: Surgical    Comment: ablation & btl  Other Topics Concern   Not on file  Social History Narrative   Patient is single she has 1 daughter   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   Never smoker no alcohol or caffeine or drug use    Social Drivers of Corporate investment banker Strain: Not on file  Food Insecurity: Not on file  Transportation Needs: Not on file  Physical Activity: Not on file  Stress: Not on file  Social Connections: Unknown (04/30/2022)   Received from [facility], [facility]   Social Network    Social Network: Not on file  Intimate Partner Violence: Unknown (03/22/2022)   Received from [place],  [facility]   HITS    Physically Hurt: Not on file    Insult or Talk Down To: Not on file    Threaten Physical Harm: Not on file    Scream or Curse: Not on file    Family History  Problem Relation Age of Onset   Hypertension Mother    Heart attack Father    Heart disease Father    Diabetes Brother    Hypertension Brother  Heart disease Brother    Heart attack Brother    Bone cancer Maternal Grandmother    Stroke Maternal Grandfather    Stomach cancer Neg Hx    Rectal cancer Neg Hx      No Known Allergies    Patient's last menstrual period was No LMP recorded. Patient has had an ablation..            Review of Systems Alls systems reviewed and are negative.     Physical Exam Constitutional:      Appearance: Normal appearance.  Genitourinary:     Vulva and urethral meatus normal.     No lesions in the vagina.     Right Labia: No rash, lesions or skin changes.    Left Labia: No lesions, skin changes or rash.    No vaginal discharge or tenderness.     No vaginal prolapse present.    No vaginal atrophy present.     Right Adnexa: not tender, not palpable and no mass present.    Left Adnexa: not tender, not palpable and no mass present.    No cervical motion tenderness or discharge.     Uterus is not enlarged, tender or irregular.  Breasts:    Right: Normal.     Left: Normal.  HENT:     Head: Normocephalic.  Neck:     Thyroid: No thyroid mass, thyromegaly or thyroid tenderness.  Cardiovascular:     Rate and Rhythm: Normal rate and regular rhythm.     Heart sounds: Normal heart sounds, S1 normal and S2 normal.  Pulmonary:     Effort: Pulmonary effort is normal.     Breath sounds: Normal breath sounds and air entry.  Abdominal:     General: There is no distension.     Palpations: Abdomen is soft. There is no mass.     Tenderness: There is no abdominal tenderness. There is no guarding or rebound.  Musculoskeletal:        General: Normal range of motion.      Cervical back: Full passive range of motion without pain, normal range of motion and neck supple. No tenderness.     Right lower leg: No edema.     Left lower leg: No edema.  Neurological:     Mental Status: She is alert.  Skin:    General: Skin is warm.  Psychiatric:        Mood and Affect: Mood normal.        Behavior: Behavior normal.        Thought Content: Thought content normal.  Vitals and nursing note reviewed. Exam conducted with a chaperone present.       A:         Well Woman GYN exam                             P:        Pap smear collected today Encouraged annual mammogram screening Colon cancer screening up-to-date DXA ordered today Labs and immunizations to do with PMD Discussed breast self exams Encouraged healthy lifestyle practices Encouraged Vit D and Calcium   No follow-ups on file.  Earley Favor

## 2024-02-25 ENCOUNTER — Ambulatory Visit
Admission: RE | Admit: 2024-02-25 | Discharge: 2024-02-25 | Disposition: A | Discharge status: Home / Self Care | Payer: Commercial Managed Care - PPO | Source: Ambulatory Visit | Attending: Obstetrics and Gynecology

## 2024-02-25 DIAGNOSIS — Z1231 Encounter for screening mammogram for malignant neoplasm of breast: Secondary | ICD-10-CM

## 2024-02-25 LAB — SURESWAB® ADVANCED VAGINITIS PLUS,TMA
C. trachomatis RNA, TMA: NOT DETECTED
CANDIDA SPECIES: NOT DETECTED
Candida glabrata: NOT DETECTED
N. gonorrhoeae RNA, TMA: NOT DETECTED
SURESWAB(R) ADV BACTERIAL VAGINOSIS(BV),TMA: POSITIVE — AB
TRICHOMONAS VAGINALIS (TV),TMA: NOT DETECTED

## 2024-02-26 ENCOUNTER — Encounter: Payer: Self-pay | Admitting: Obstetrics and Gynecology

## 2024-02-26 MED ORDER — METRONIDAZOLE 500 MG PO TABS: 500.0000 mg | ORAL_TABLET | Freq: Two times a day (BID) | ORAL | 0 refills | Status: AC

## 2024-02-27 LAB — CYTOLOGY - PAP
Adequacy: ABSENT
Diagnosis: NEGATIVE

## 2024-03-01 ENCOUNTER — Encounter: Payer: Self-pay | Admitting: Obstetrics and Gynecology

## 2024-03-11 ENCOUNTER — Other Ambulatory Visit (HOSPITAL_BASED_OUTPATIENT_CLINIC_OR_DEPARTMENT_OTHER)

## 2024-03-18 ENCOUNTER — Other Ambulatory Visit (HOSPITAL_COMMUNITY): Payer: Self-pay | Admitting: Orthopedic Surgery

## 2024-03-18 DIAGNOSIS — M79669 Pain in unspecified lower leg: Secondary | ICD-10-CM

## 2024-03-19 ENCOUNTER — Ambulatory Visit (HOSPITAL_COMMUNITY)
Admission: RE | Admit: 2024-03-19 | Discharge: 2024-03-19 | Disposition: A | Discharge status: Home / Self Care | Source: Ambulatory Visit | Attending: Orthopedic Surgery | Admitting: Orthopedic Surgery

## 2024-03-19 DIAGNOSIS — M79662 Pain in left lower leg: Secondary | ICD-10-CM | POA: Diagnosis not present

## 2024-03-19 DIAGNOSIS — M79669 Pain in unspecified lower leg: Secondary | ICD-10-CM | POA: Insufficient documentation

## 2024-03-24 ENCOUNTER — Ambulatory Visit (HOSPITAL_BASED_OUTPATIENT_CLINIC_OR_DEPARTMENT_OTHER): Admission: RE | Admit: 2024-03-24 | Source: Ambulatory Visit | Admitting: Radiology

## 2024-03-31 ENCOUNTER — Other Ambulatory Visit (HOSPITAL_BASED_OUTPATIENT_CLINIC_OR_DEPARTMENT_OTHER)

## 2024-04-06 ENCOUNTER — Ambulatory Visit (HOSPITAL_BASED_OUTPATIENT_CLINIC_OR_DEPARTMENT_OTHER)

## 2024-09-28 ENCOUNTER — Ambulatory Visit: Payer: Self-pay | Admitting: Orthopedic Surgery

## 2024-11-09 ENCOUNTER — Ambulatory Visit: Payer: Self-pay | Admitting: Orthopedic Surgery

## 2024-11-09 NOTE — H&P (Signed)
 Denise Summers is an 53 y.o. female.   Chief Complaint: left knee pain HPI: The patient is going to have a  left total knee arthroplasty performed by Dr. Reyes Billing on 11/17/2024 at [facility]. Please see electronic hospital medical record for complete details.   Dr. Billing and the patient mutually agreed to proceed with a total knee replacement. Risks and benefits of the procedure were discussed including stiffness, suboptimal range of motion, persistent pain, infection requiring removal of prosthesis and reinsertion, need for prophylactic antibiotics in the future, for example, dental procedures, possible need for manipulation, revision in the future and also anesthetic complications including DVT, PE, etc. We discussed the perioperative course, time in the hospital, postoperative recovery and the need for elevation to control swelling. We also discussed the predicted range of motion and the probability that squatting and kneeling would be unobtainable in the future. In addition, postoperative anticoagulation was discussed. We have obtained preoperative medical clearance as necessary. Provided illustrated handout and discussed it in detail. They will enroll in the total joint replacement educational forum at the hospital.  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, ok with recent surgeries   Constipation    Depression    GERD (gastroesophageal reflux disease)    silent reflux   Obesity    Other vitamin B12 deficiency anemias 11/29/2020    Past Surgical History:  Procedure Laterality Date   ABDOMINOPLASTY     AUGMENTATION MAMMAPLASTY     2019 and 2020   BREAST BIOPSY Right    2017   CHOLECYSTECTOMY     CORRECTION HAMMER TOE Bilateral    DILATATION & CURETTAGE/HYSTEROSCOPY WITH MYOSURE N/A 05/09/2020   Procedure: DILATATION & CURETTAGE/HYSTEROSCOPY WITH  MYOSURE;  Surgeon: Jannis Kate Norris, MD;  Location: [facility] [place];  Service: Gynecology;  Laterality: N/A;   ENDOMETRIAL ABLATION     ESOPHAGOGASTRODUODENOSCOPY (EGD) WITH PROPOFOL  N/A 07/14/2014   Procedure: ESOPHAGOGASTRODUODENOSCOPY (EGD) WITH PROPOFOL ;  Surgeon: Renaye Sous, MD;  Location: [facility];  Service: Endoscopy;  Laterality: N/A;   LAPAROSCOPIC GASTRIC SLEEVE RESECTION WITH HIATAL HERNIA REPAIR N/A 10/30/2015   Procedure: LAPAROSCOPIC GASTRIC SLEEVE RESECTION WITH REPAIR OF HIATAL HERNIA REPAIR;  Surgeon: Morene Olives, MD;  Location: [facility];  Service: General;  Laterality: N/A;   OPERATIVE ULTRASOUND N/A 05/09/2020   Procedure: OPERATIVE ULTRASOUND;  Surgeon: Jannis Kate Norris, MD;  Location: [facility];  Service: Gynecology;  Laterality: N/A;   TUBAL LIGATION     UPPER GI ENDOSCOPY N/A 10/30/2015   Procedure: UPPER GI ENDOSCOPY;  Surgeon: Morene Olives, MD;  Location: [facility];  Service: General;  Laterality: N/A;    Family History  Problem Relation Age of Onset   Hypertension Mother    Heart attack Father    Heart disease Father    Diabetes Brother    Hypertension Brother    Heart disease Brother    Heart attack Brother    Bone cancer Maternal Grandmother    Stroke Maternal Grandfather    Stomach cancer Neg Hx    Rectal cancer Neg Hx    Social History:  reports that she has never smoked. She has never used smokeless tobacco. She reports that she does not currently use alcohol. She reports that she does not use drugs.  Allergies: No Known Allergies  Current meds: chlorhexidine  gluconate 4 % topical  liquid cyanocobalamin (vit B-12) 1,000 mcg/mL injection solution meloxicam 15 mg tablet pantoprazole  40 mg tablet,delayed release phentermine 37.5 mg tablet  Review of Systems  Constitutional: Negative.   HENT: Negative.    Eyes: Negative.   Respiratory: Negative.    Cardiovascular: Negative.   Gastrointestinal:  Negative.   Endocrine: Negative.   Genitourinary: Negative.   Musculoskeletal:  Positive for arthralgias, gait problem and joint swelling.  Skin: Negative.   Psychiatric/Behavioral: Negative.      There were no vitals taken for this visit. Physical Exam Constitutional:      Appearance: Normal appearance.  HENT:     Head: Normocephalic and atraumatic.     Right Ear: External ear normal.     Left Ear: External ear normal.     Nose: Nose normal.     Mouth/Throat:     Mouth: Mucous membranes are moist.     Pharynx: Oropharynx is clear.  Eyes:     Conjunctiva/sclera: Conjunctivae normal.  Cardiovascular:     Rate and Rhythm: Normal rate.     Pulses: Normal pulses.  Pulmonary:     Effort: Pulmonary effort is normal.  Abdominal:     General: Abdomen is flat.  Musculoskeletal:     Cervical back: Normal range of motion.     Comments: Exquisitely tender to palpation in the medial joint line as patellofemoral pain compression. Ranges 0-1 40. No instability ipsilateral hip and ankle exam is unremarkable    Skin:    General: Skin is warm and dry.  Neurological:     Mental Status: She is alert.    Repeat x-rays including PA bent knee weightbearing demonstrates essentially bone-on-bone medial compartment of the left knee. Intraoperative arthroscopy demonstrated grade 4 changes of the medial compartment  Assessment/Plan Impression: Left knee end-stage osteoarthritis  Plan:  Pt with end-stage left knee DJD, bone-on-bone, refractory to conservative tx, scheduled for left total knee replacement by Dr. Duwayne on December 3. We again discussed the procedure itself as well as risks, complications and alternatives, including but not limited to DVT, PE, infx, bleeding, failure of procedure, need for secondary procedure including manipulation, nerve injury, ongoing pain/symptoms, anesthesia risk, even stroke or death. Also discussed typical post-op protocols, activity restrictions, need for PT,  flexion/extension exercises, time out of work. Discussed need for DVT ppx post-op per protocol. Discussed dental ppx and infx prevention. Also discussed limitations post-operatively such as kneeling and squatting. All questions were answered. Patient desires to proceed with surgery as scheduled.  Will hold supplements, ASA and NSAIDs accordingly. Will remain NPO after midnight the night before surgery. Will present to [facility] for pre-op testing. Anticipate hospital stay to include at least 2 midnights given medical history and to ensure proper pain control. Plan aspirin for DVT ppx post-op. Plan oxycodone , Robaxin, Colace, Miralax. Plan home with HHPT post-op with family members at home for assistance. Will follow up 10-14 days post-op for staple removal and xrays.  Plan left total knee replacement  Joyann Spidle M Chrishana Spargur, PA-C for Dr Duwayne 11/09/2024, 8:52 AM

## 2024-11-09 NOTE — H&P (View-Only) (Signed)
 Denise Summers is an 53 y.o. female.   Chief Complaint: left knee pain HPI: The patient is going to have a  left total knee arthroplasty performed by Dr. Reyes Billing on 11/17/2024 at [facility]. Please see electronic hospital medical record for complete details.   Dr. Billing and the patient mutually agreed to proceed with a total knee replacement. Risks and benefits of the procedure were discussed including stiffness, suboptimal range of motion, persistent pain, infection requiring removal of prosthesis and reinsertion, need for prophylactic antibiotics in the future, for example, dental procedures, possible need for manipulation, revision in the future and also anesthetic complications including DVT, PE, etc. We discussed the perioperative course, time in the hospital, postoperative recovery and the need for elevation to control swelling. We also discussed the predicted range of motion and the probability that squatting and kneeling would be unobtainable in the future. In addition, postoperative anticoagulation was discussed. We have obtained preoperative medical clearance as necessary. Provided illustrated handout and discussed it in detail. They will enroll in the total joint replacement educational forum at the hospital.  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, ok with recent surgeries   Constipation    Depression    GERD (gastroesophageal reflux disease)    silent reflux   Obesity    Other vitamin B12 deficiency anemias 11/29/2020    Past Surgical History:  Procedure Laterality Date   ABDOMINOPLASTY     AUGMENTATION MAMMAPLASTY     2019 and 2020   BREAST BIOPSY Right    2017   CHOLECYSTECTOMY     CORRECTION HAMMER TOE Bilateral    DILATATION & CURETTAGE/HYSTEROSCOPY WITH MYOSURE N/A 05/09/2020   Procedure: DILATATION & CURETTAGE/HYSTEROSCOPY WITH  MYOSURE;  Surgeon: Jannis Kate Norris, MD;  Location: [facility] [place];  Service: Gynecology;  Laterality: N/A;   ENDOMETRIAL ABLATION     ESOPHAGOGASTRODUODENOSCOPY (EGD) WITH PROPOFOL  N/A 07/14/2014   Procedure: ESOPHAGOGASTRODUODENOSCOPY (EGD) WITH PROPOFOL ;  Surgeon: Renaye Sous, MD;  Location: [facility];  Service: Endoscopy;  Laterality: N/A;   LAPAROSCOPIC GASTRIC SLEEVE RESECTION WITH HIATAL HERNIA REPAIR N/A 10/30/2015   Procedure: LAPAROSCOPIC GASTRIC SLEEVE RESECTION WITH REPAIR OF HIATAL HERNIA REPAIR;  Surgeon: Morene Olives, MD;  Location: [facility];  Service: General;  Laterality: N/A;   OPERATIVE ULTRASOUND N/A 05/09/2020   Procedure: OPERATIVE ULTRASOUND;  Surgeon: Jannis Kate Norris, MD;  Location: [facility];  Service: Gynecology;  Laterality: N/A;   TUBAL LIGATION     UPPER GI ENDOSCOPY N/A 10/30/2015   Procedure: UPPER GI ENDOSCOPY;  Surgeon: Morene Olives, MD;  Location: [facility];  Service: General;  Laterality: N/A;    Family History  Problem Relation Age of Onset   Hypertension Mother    Heart attack Father    Heart disease Father    Diabetes Brother    Hypertension Brother    Heart disease Brother    Heart attack Brother    Bone cancer Maternal Grandmother    Stroke Maternal Grandfather    Stomach cancer Neg Hx    Rectal cancer Neg Hx    Social History:  reports that she has never smoked. She has never used smokeless tobacco. She reports that she does not currently use alcohol. She reports that she does not use drugs.  Allergies: No Known Allergies  Current meds: chlorhexidine  gluconate 4 % topical  liquid cyanocobalamin (vit B-12) 1,000 mcg/mL injection solution meloxicam 15 mg tablet pantoprazole  40 mg tablet,delayed release phentermine 37.5 mg tablet  Review of Systems  Constitutional: Negative.   HENT: Negative.    Eyes: Negative.   Respiratory: Negative.    Cardiovascular: Negative.   Gastrointestinal:  Negative.   Endocrine: Negative.   Genitourinary: Negative.   Musculoskeletal:  Positive for arthralgias, gait problem and joint swelling.  Skin: Negative.   Psychiatric/Behavioral: Negative.      There were no vitals taken for this visit. Physical Exam Constitutional:      Appearance: Normal appearance.  HENT:     Head: Normocephalic and atraumatic.     Right Ear: External ear normal.     Left Ear: External ear normal.     Nose: Nose normal.     Mouth/Throat:     Mouth: Mucous membranes are moist.     Pharynx: Oropharynx is clear.  Eyes:     Conjunctiva/sclera: Conjunctivae normal.  Cardiovascular:     Rate and Rhythm: Normal rate.     Pulses: Normal pulses.  Pulmonary:     Effort: Pulmonary effort is normal.  Abdominal:     General: Abdomen is flat.  Musculoskeletal:     Cervical back: Normal range of motion.     Comments: Exquisitely tender to palpation in the medial joint line as patellofemoral pain compression. Ranges 0-1 40. No instability ipsilateral hip and ankle exam is unremarkable    Skin:    General: Skin is warm and dry.  Neurological:     Mental Status: She is alert.    Repeat x-rays including PA bent knee weightbearing demonstrates essentially bone-on-bone medial compartment of the left knee. Intraoperative arthroscopy demonstrated grade 4 changes of the medial compartment  Assessment/Plan Impression: Left knee end-stage osteoarthritis  Plan:  Pt with end-stage left knee DJD, bone-on-bone, refractory to conservative tx, scheduled for left total knee replacement by Dr. Duwayne on December 3. We again discussed the procedure itself as well as risks, complications and alternatives, including but not limited to DVT, PE, infx, bleeding, failure of procedure, need for secondary procedure including manipulation, nerve injury, ongoing pain/symptoms, anesthesia risk, even stroke or death. Also discussed typical post-op protocols, activity restrictions, need for PT,  flexion/extension exercises, time out of work. Discussed need for DVT ppx post-op per protocol. Discussed dental ppx and infx prevention. Also discussed limitations post-operatively such as kneeling and squatting. All questions were answered. Patient desires to proceed with surgery as scheduled.  Will hold supplements, ASA and NSAIDs accordingly. Will remain NPO after midnight the night before surgery. Will present to [facility] for pre-op testing. Anticipate hospital stay to include at least 2 midnights given medical history and to ensure proper pain control. Plan aspirin for DVT ppx post-op. Plan oxycodone , Robaxin, Colace, Miralax. Plan home with HHPT post-op with family members at home for assistance. Will follow up 10-14 days post-op for staple removal and xrays.  Plan left total knee replacement  Joyann Spidle M Chrishana Spargur, PA-C for Dr Duwayne 11/09/2024, 8:52 AM

## 2024-11-10 ENCOUNTER — Other Ambulatory Visit: Payer: Self-pay

## 2024-11-10 ENCOUNTER — Encounter (HOSPITAL_COMMUNITY): Payer: Self-pay

## 2024-11-10 ENCOUNTER — Encounter (HOSPITAL_COMMUNITY)
Admission: RE | Admit: 2024-11-10 | Discharge: 2024-11-10 | Disposition: A | Discharge status: Home / Self Care | Source: Ambulatory Visit | Attending: Specialist | Admitting: Specialist

## 2024-11-10 VITALS — BP 99/61 | HR 65 | Temp 97.9°F | Resp 16 | Ht 62.0 in

## 2024-11-10 DIAGNOSIS — E1165 Type 2 diabetes mellitus with hyperglycemia: Secondary | ICD-10-CM | POA: Insufficient documentation

## 2024-11-10 DIAGNOSIS — Z01818 Encounter for other preprocedural examination: Secondary | ICD-10-CM | POA: Diagnosis present

## 2024-11-10 HISTORY — DX: Unspecified osteoarthritis, unspecified site: M19.90

## 2024-11-10 LAB — CBC
HCT: 38.9 % (ref 36.0–46.0)
Hemoglobin: 12.6 g/dL (ref 12.0–15.0)
MCH: 32.1 pg (ref 26.0–34.0)
MCHC: 32.4 g/dL (ref 30.0–36.0)
MCV: 99.2 fL (ref 80.0–100.0)
Platelets: 279 K/uL (ref 150–400)
RBC: 3.92 MIL/uL (ref 3.87–5.11)
RDW: 13 % (ref 11.5–15.5)
WBC: 5.3 K/uL (ref 4.0–10.5)
nRBC: 0 % (ref 0.0–0.2)

## 2024-11-10 LAB — BASIC METABOLIC PANEL WITH GFR
Anion gap: 8 (ref 5–15)
BUN: 18 mg/dL (ref 6–20)
CO2: 28 mmol/L (ref 22–32)
Calcium: 9.1 mg/dL (ref 8.9–10.3)
Chloride: 103 mmol/L (ref 98–111)
Creatinine, Ser: 0.76 mg/dL (ref 0.44–1.00)
GFR, Estimated: >60 mL/min (ref >60)
Glucose, Bld: 89 mg/dL (ref 70–99)
Potassium: 4.6 mmol/L (ref 3.5–5.1)
Sodium: 139 mmol/L (ref 135–145)

## 2024-11-10 LAB — SURGICAL PCR SCREEN
MRSA, PCR: NEGATIVE
Staphylococcus aureus: NEGATIVE

## 2024-11-10 NOTE — Progress Notes (Addendum)
 Date of COVID positive in last 90 days:  PCP - Kerney Reese, MD Cardiologist -   Chest x-ray - N/A EKG - yes with PCP- requested Stress Test - N/A ECHO - N/A Cardiac Cath - N/A Pacemaker/ICD device last checked:N/A Spinal Cord Stimulator:N/A  Bowel Prep - N/A  Sleep Study - N/A CPAP -   Fasting Blood Sugar - N/A Checks Blood Sugar _____ times a day  Last dose of GLP1 agonist-  N/A GLP1 instructions:  Do not take after     Last dose of SGLT-2 inhibitors-  N/A SGLT-2 instructions:  Do not take after     Blood Thinner Instructions: N/A Last dose:   Time: Aspirin Instructions:N/A Last Dose:  Activity level: Can go up a flight of stairs and perform activities of daily living without stopping and without symptoms of chest pain or shortness of breath.   Anesthesia review: N/A  Patient denies shortness of breath, fever, cough and chest pain at PAT appointment  Patient verbalized understanding of instructions that were given to them at the PAT appointment. Patient was also instructed that they will need to review over the PAT instructions again at home before surgery.

## 2024-11-10 NOTE — Patient Instructions (Addendum)
 SURGICAL WAITING ROOM VISITATION  Patients having surgery or a procedure may have no more than 2 support people in the waiting area - these visitors may rotate.    Children under the age of 20 must have an adult with them who is not the patient.  Visitors with respiratory illnesses are discouraged from visiting and should remain at home.  If the patient needs to stay at the hospital during part of their recovery, the visitor guidelines for inpatient rooms apply. Pre-op nurse will coordinate an appropriate time for 1 support person to accompany patient in pre-op.  This support person may not rotate.    Please refer to the [facility] website for the visitor guidelines for Inpatients (after your surgery is over and you are in a regular room).    Your procedure is scheduled on: 11/17/24   Report to [facility] Main Entrance    Report to admitting at 8:50 AM   Call this number if you have problems the morning of surgery [phone]   Do not eat food :After Midnight.   After Midnight you may have the following liquids until 8:20 AM DAY OF SURGERY  Water Non-Citrus Juices (without pulp, NO RED-Apple, White grape, White cranberry) Black Coffee (NO MILK/CREAM OR CREAMERS, sugar ok)  Clear Tea (NO MILK/CREAM OR CREAMERS, sugar ok) regular and decaf                             Plain Jell-O (NO RED)                                           Fruit ices (not with fruit pulp, NO RED)                                     Popsicles (NO RED)                                                               Sports drinks like Gatorade (NO RED)     The day of surgery:  Drink ONE (1) Pre-Surgery Clear Ensure at 8:20 AM the morning of surgery. Drink in one sitting. Do not sip.  This drink was given to you during your hospital  pre-op appointment visit. Nothing else to drink after completing the  Pre-Surgery Clear Ensure.          If you have questions, please contact your surgeon's  office.   FOLLOW BOWEL PREP AND ANY ADDITIONAL PRE OP INSTRUCTIONS YOU RECEIVED FROM YOUR SURGEON'S OFFICE!!!     Oral Hygiene is also important to reduce your risk of infection.                                    Remember - BRUSH YOUR TEETH THE MORNING OF SURGERY WITH YOUR REGULAR TOOTHPASTE  DENTURES WILL BE REMOVED PRIOR TO SURGERY PLEASE DO NOT APPLY Poly grip OR ADHESIVES!!!   Stop all vitamins and herbal supplements 7 days before  surgery.   Take these medicines the morning of surgery with A SIP OF WATER: Alprazolam , Pantoprazole                                You may not have any metal on your body including hair pins, jewelry, and body piercing             Do not wear make-up, lotions, powders, perfumes/cologne, or deodorant  Do not wear nail polish including gel and S&S, artificial/acrylic nails, or any other type of covering on natural nails including finger and toenails. If you have artificial nails, gel coating, etc. that needs to be removed by a nail salon please have this removed prior to surgery or surgery may need to be canceled/ delayed if the surgeon/ anesthesia feels like they are unable to be safely monitored.   Do not shave  48 hours prior to surgery.    Do not bring valuables to the hospital. [place] IS NOT             RESPONSIBLE   FOR VALUABLES.   Contacts, glasses, dentures or bridgework may not be worn into surgery.   Bring small overnight bag day of surgery.   DO NOT BRING YOUR HOME MEDICATIONS TO THE HOSPITAL. PHARMACY WILL DISPENSE MEDICATIONS LISTED ON YOUR MEDICATION LIST TO YOU DURING YOUR ADMISSION IN THE HOSPITAL!              Please read over the following fact sheets you were given: IF YOU HAVE QUESTIONS ABOUT YOUR PRE-OP INSTRUCTIONS PLEASE CALL [phone].   If you received a COVID test during your pre-op visit  it is requested that you wear a mask when out in public, stay away from anyone that may not be feeling well and notify  your surgeon if you develop symptoms. If you test positive for Covid or have been in contact with anyone that has tested positive in the last 10 days please notify you surgeon.      Pre-operative 4 CHG Bath Instructions  DYNA-Hex 4 Chlorhexidine  Gluconate 4% Solution Antiseptic 4 fl. oz   You can play a key role in reducing the risk of infection after surgery. Your skin needs to be as free of germs as possible. You can reduce the number of germs on your skin by washing with CHG (chlorhexidine  gluconate) soap before surgery. CHG is an antiseptic soap that kills germs and continues to kill germs even after washing.   DO NOT use if you have an allergy to chlorhexidine /CHG or antibacterial soaps. If your skin becomes reddened or irritated, stop using the CHG and notify one of our RNs at   Please shower with the CHG soap starting 4 days before surgery using the following schedule:     Please keep in mind the following:  DO NOT shave, including legs and underarms, starting the day of your first shower.   You may shave your face at any point before/day of surgery.  Place clean sheets on your bed the day you start using CHG soap. Use a clean washcloth (not used since being washed) for each shower. DO NOT sleep with pets once you start using the CHG.  CHG Shower Instructions:  If you choose to wash your hair and private area, wash first with your normal shampoo/soap.  After you use shampoo/soap, rinse your hair and body thoroughly to remove shampoo/soap residue.  Turn the water OFF  and apply about 3 tablespoons (45 ml) of CHG soap to a CLEAN washcloth.  Apply CHG soap ONLY FROM YOUR NECK DOWN TO YOUR TOES (washing for 3-5 minutes)  DO NOT use CHG soap on face, private areas, open wounds, or sores.  Pay special attention to the area where your surgery is being performed.  If you are having back surgery, having someone wash your back for you may be helpful. Wait 2 minutes after CHG soap is  applied, then you may rinse off the CHG soap.  Pat dry with a clean towel  Put on clean clothes/pajamas   If you choose to wear lotion, please use ONLY the CHG-compatible lotions on the back of this paper.     Additional instructions for the day of surgery: DO NOT APPLY any lotions, deodorants, cologne, or perfumes.   Put on clean/comfortable clothes.  Brush your teeth.  Ask your nurse before applying any prescription medications to the skin.   CHG Compatible Lotions   Aveeno Moisturizing lotion  Cetaphil Moisturizing Cream  Cetaphil Moisturizing Lotion  Clairol Herbal Essence Moisturizing Lotion, Dry Skin  Clairol Herbal Essence Moisturizing Lotion, Extra Dry Skin  Clairol Herbal Essence Moisturizing Lotion, Normal Skin  Curel Age Defying Therapeutic Moisturizing Lotion with Alpha Hydroxy  Curel Extreme Care Body Lotion  Curel Soothing Hands Moisturizing Hand Lotion  Curel Therapeutic Moisturizing Cream, Fragrance-Free  Curel Therapeutic Moisturizing Lotion, Fragrance-Free  Curel Therapeutic Moisturizing Lotion, Original Formula  Eucerin Daily Replenishing Lotion  Eucerin Dry Skin Therapy Plus Alpha Hydroxy Crme  Eucerin Dry Skin Therapy Plus Alpha Hydroxy Lotion  Eucerin Original Crme  Eucerin Original Lotion  Eucerin Plus Crme Eucerin Plus Lotion  Eucerin TriLipid Replenishing Lotion  Keri Anti-Bacterial Hand Lotion  Keri Deep Conditioning Original Lotion Dry Skin Formula Softly Scented  Keri Deep Conditioning Original Lotion, Fragrance Free Sensitive Skin Formula  Keri Lotion Fast Absorbing Fragrance Free Sensitive Skin Formula  Keri Lotion Fast Absorbing Softly Scented Dry Skin Formula  Keri Original Lotion  Keri Skin Renewal Lotion Keri Silky Smooth Lotion  Keri Silky Smooth Sensitive Skin Lotion  Nivea Body Creamy Conditioning Oil  Nivea Body Extra Enriched Lotion  Nivea Body Original Lotion  Nivea Body Sheer Moisturizing Lotion Nivea Crme  Nivea Skin Firming  Lotion  NutraDerm 30 Skin Lotion  NutraDerm Skin Lotion  NutraDerm Therapeutic Skin Cream  NutraDerm Therapeutic Skin Lotion  ProShield Protective Hand Cream  Provon moisturizing lotion  View Pre-Surgery Education Videos:  [email or web address]     Incentive Spirometer  An incentive spirometer is a tool that can help keep your lungs clear and active. This tool measures how well you are filling your lungs with each breath. Taking long deep breaths may help reverse or decrease the chance of developing breathing (pulmonary) problems (especially infection) following: A long period of time when you are unable to move or be active. BEFORE THE PROCEDURE  If the spirometer includes an indicator to show your best effort, your nurse or respiratory therapist will set it to a desired goal. If possible, sit up straight or lean slightly forward. Try not to slouch. Hold the incentive spirometer in an upright position. INSTRUCTIONS FOR USE  Sit on the edge of your bed if possible, or sit up as far as you can in bed or on a chair. Hold the incentive spirometer in an upright position. Breathe out normally. Place the mouthpiece in your mouth and seal your lips tightly around it. Breathe in slowly and as deeply as  possible, raising the piston or the ball toward the top of the column. Hold your breath for 3-5 seconds or for as long as possible. Allow the piston or ball to fall to the bottom of the column. Remove the mouthpiece from your mouth and breathe out normally. Rest for a few seconds and repeat Steps 1 through 7 at least 10 times every 1-2 hours when you are awake. Take your time and take a few normal breaths between deep breaths. The spirometer may include an indicator to show your best effort. Use the indicator as a goal to work toward during each repetition. After each set of 10 deep breaths, practice coughing to be sure your lungs are  clear. If you have an incision (the cut made at the time of surgery), support your incision when coughing by placing a pillow or rolled up towels firmly against it. Once you are able to get out of bed, walk around indoors and cough well. You may stop using the incentive spirometer when instructed by your caregiver.  RISKS AND COMPLICATIONS Take your time so you do not get dizzy or light-headed. If you are in pain, you may need to take or ask for pain medication before doing incentive spirometry. It is harder to take a deep breath if you are having pain. AFTER USE Rest and breathe slowly and easily. It can be helpful to keep track of a log of your progress. Your caregiver can provide you with a simple table to help with this. If you are using the spirometer at home, follow these instructions: SEEK MEDICAL CARE IF:  You are having difficultly using the spirometer. You have trouble using the spirometer as often as instructed. Your pain medication is not giving enough relief while using the spirometer. You develop fever of 100.5 F (38.1 C) or higher. SEEK IMMEDIATE MEDICAL CARE IF:  You cough up bloody sputum that had not been present before. You develop fever of 102 F (38.9 C) or greater. You develop worsening pain at or near the incision site. MAKE SURE YOU:  Understand these instructions. Will watch your condition. Will get help right away if you are not doing well or get worse. Document Released: 04/14/2007 Document Revised: 02/24/2012 Document Reviewed: 06/15/2007 [facility] Patient Information 2014 [place], [place].   ________________________________________________________________________

## 2024-11-16 NOTE — Progress Notes (Signed)
 [facility]  PROCEDURAL EXPEDITER PROGRESS NOTE  Patient Name: Denise Summers  DOB:05-Oct-1971 Date of Admission: (Not on file)  Date of Assessment:11/16/24   -------------------------------------------------------------------------------------------------------------------   Brief clinical summary: pt is a 53 yr old female having surgery on 11/17/2024   right knee replacement.    Orders in place:  Yes   Communication with surgical team if no orders: n/a  Labs, test, and orders reviewed: yes  Requires surgical clearance:  No  What type of clearance: n/a  Clearance received: n/a  Barriers noted:n/a   Intervention provided by [facility] team: n/a  Barrier resolved:  not applicable   -------------------------------------------------------------------------------------------------------------------  [employer], Ronal DELENA Bald Please contact us  directly via secure chat (search for [facility]) or by calling us  at [phone] [employer]).

## 2024-11-17 ENCOUNTER — Other Ambulatory Visit: Payer: Self-pay

## 2024-11-17 ENCOUNTER — Ambulatory Visit (HOSPITAL_COMMUNITY): Payer: Self-pay | Admitting: Medical

## 2024-11-17 ENCOUNTER — Encounter (HOSPITAL_COMMUNITY): Admission: RE | Disposition: A | Payer: Self-pay | Source: Home / Self Care | Attending: Specialist

## 2024-11-17 ENCOUNTER — Ambulatory Visit (HOSPITAL_COMMUNITY)

## 2024-11-17 ENCOUNTER — Encounter (HOSPITAL_COMMUNITY): Payer: Self-pay | Admitting: Specialist

## 2024-11-17 ENCOUNTER — Ambulatory Visit (HOSPITAL_COMMUNITY): Admitting: Certified Registered"

## 2024-11-17 ENCOUNTER — Inpatient Hospital Stay (HOSPITAL_COMMUNITY)
Admission: RE | Admit: 2024-11-17 | Discharge: 2024-11-19 | DRG: 470 | Disposition: A | Attending: Specialist | Admitting: Specialist

## 2024-11-17 DIAGNOSIS — E1165 Type 2 diabetes mellitus with hyperglycemia: Secondary | ICD-10-CM

## 2024-11-17 DIAGNOSIS — M1712 Unilateral primary osteoarthritis, left knee: Principal | ICD-10-CM | POA: Diagnosis present

## 2024-11-17 HISTORY — PX: TOTAL KNEE ARTHROPLASTY: SHX125

## 2024-11-17 SURGERY — ARTHROPLASTY, KNEE, TOTAL
Anesthesia: Spinal | Site: Knee | Laterality: Left

## 2024-11-17 MED ORDER — CYANOCOBALAMIN 1000 MCG/ML IJ SOLN: 1000.0000 ug | INTRAMUSCULAR | Status: DC

## 2024-11-17 MED ORDER — ACETAMINOPHEN 500 MG PO TABS
1000.0000 mg | ORAL_TABLET | Freq: Four times a day (QID) | ORAL | Status: AC
  Administered 2024-11-17 – 2024-11-18 (×3): 1000 mg via ORAL
  Filled 2024-11-17 (×3): qty 2

## 2024-11-17 MED ORDER — POLYETHYLENE GLYCOL 3350 17 G PO PACK
17.0000 g | PACK | Freq: Every day | ORAL | Status: DC
  Administered 2024-11-17 – 2024-11-19 (×3): 17 g via ORAL
  Filled 2024-11-17 (×3): qty 1

## 2024-11-17 MED ORDER — OXYCODONE HCL 5 MG PO TABS
5.0000 mg | ORAL_TABLET | ORAL | Status: DC | PRN
  Filled 2024-11-17 (×2): qty 2

## 2024-11-17 MED ORDER — MAGNESIUM CITRATE PO SOLN: 1.0000 | Freq: Once | ORAL | Status: DC | PRN

## 2024-11-17 MED ORDER — STERILE WATER FOR IRRIGATION IR SOLN
Status: DC | PRN
  Administered 2024-11-17: 2000 mL

## 2024-11-17 MED ORDER — METHOCARBAMOL 500 MG PO TABS
500.0000 mg | ORAL_TABLET | Freq: Four times a day (QID) | ORAL | Status: DC | PRN
  Administered 2024-11-17 – 2024-11-18 (×5): 500 mg via ORAL
  Filled 2024-11-17 (×5): qty 1

## 2024-11-17 MED ORDER — ONDANSETRON HCL 4 MG PO TABS: 4.0000 mg | ORAL_TABLET | Freq: Four times a day (QID) | ORAL | Status: DC | PRN

## 2024-11-17 MED ORDER — CHLORHEXIDINE GLUCONATE 0.12 % MT SOLN
15.0000 mL | Freq: Once | OROMUCOSAL | Status: AC
  Administered 2024-11-17: 15 mL via OROMUCOSAL

## 2024-11-17 MED ORDER — ONDANSETRON HCL 4 MG/2ML IJ SOLN
INTRAMUSCULAR | Status: DC | PRN
  Administered 2024-11-17: 4 mg via INTRAVENOUS

## 2024-11-17 MED ORDER — MENTHOL 3 MG MT LOZG: 1.0000 | LOZENGE | OROMUCOSAL | Status: DC | PRN

## 2024-11-17 MED ORDER — OXYCODONE HCL 5 MG PO TABS
10.0000 mg | ORAL_TABLET | ORAL | Status: DC | PRN
  Administered 2024-11-17 (×2): 10 mg via ORAL
  Administered 2024-11-17 – 2024-11-18 (×2): 15 mg via ORAL
  Administered 2024-11-18: 10 mg via ORAL
  Administered 2024-11-18: 15 mg via ORAL
  Filled 2024-11-17: qty 3
  Filled 2024-11-17: qty 2
  Filled 2024-11-17 (×3): qty 3

## 2024-11-17 MED ORDER — ACETAMINOPHEN 10 MG/ML IV SOLN
1000.0000 mg | INTRAVENOUS | Status: AC
  Administered 2024-11-17: 1000 mg via INTRAVENOUS
  Filled 2024-11-17: qty 100

## 2024-11-17 MED ORDER — MIDAZOLAM HCL 5 MG/5ML IJ SOLN
INTRAMUSCULAR | Status: DC | PRN
  Administered 2024-11-17 (×2): 1 mg via INTRAVENOUS

## 2024-11-17 MED ORDER — ALUM & MAG HYDROXIDE-SIMETH 200-200-20 MG/5ML PO SUSP: 30.0000 mL | ORAL | Status: DC | PRN

## 2024-11-17 MED ORDER — PHENOL 1.4 % MT LIQD: 1.0000 | OROMUCOSAL | Status: DC | PRN

## 2024-11-17 MED ORDER — KCL IN DEXTROSE-NACL 20-5-0.9 MEQ/L-%-% IV SOLN
INTRAVENOUS | Status: AC
  Filled 2024-11-17 (×2): qty 1000

## 2024-11-17 MED ORDER — OXYCODONE HCL 5 MG PO TABS: 5.0000 mg | ORAL_TABLET | Freq: Once | ORAL | Status: DC | PRN

## 2024-11-17 MED ORDER — BUPIVACAINE-EPINEPHRINE (PF) 0.25% -1:200000 IJ SOLN
INTRAMUSCULAR | Status: DC | PRN
  Administered 2024-11-17: 30 mL

## 2024-11-17 MED ORDER — CEFAZOLIN SODIUM-DEXTROSE 2-4 GM/100ML-% IV SOLN
2.0000 g | INTRAVENOUS | Status: AC
  Administered 2024-11-17: 2 g via INTRAVENOUS
  Filled 2024-11-17: qty 100

## 2024-11-17 MED ORDER — DEXAMETHASONE SOD PHOSPHATE PF 10 MG/ML IJ SOLN
INTRAMUSCULAR | Status: DC | PRN
  Administered 2024-11-17: 10 mg via INTRAVENOUS

## 2024-11-17 MED ORDER — PROPOFOL 1000 MG/100ML IV EMUL
INTRAVENOUS | Status: AC
  Filled 2024-11-17: qty 100

## 2024-11-17 MED ORDER — DIPHENHYDRAMINE HCL 12.5 MG/5ML PO ELIX: 12.5000 mg | ORAL_SOLUTION | ORAL | Status: DC | PRN

## 2024-11-17 MED ORDER — ASPIRIN 81 MG PO CHEW
81.0000 mg | CHEWABLE_TABLET | Freq: Two times a day (BID) | ORAL | Status: DC
  Administered 2024-11-18 – 2024-11-19 (×2): 81 mg via ORAL
  Filled 2024-11-17 (×3): qty 1

## 2024-11-17 MED ORDER — FENTANYL CITRATE (PF) 100 MCG/2ML IJ SOLN
INTRAMUSCULAR | Status: DC | PRN
  Administered 2024-11-17 (×2): 50 ug via INTRAVENOUS

## 2024-11-17 MED ORDER — OXYCODONE HCL 5 MG/5ML PO SOLN: 5.0000 mg | Freq: Once | ORAL | Status: DC | PRN

## 2024-11-17 MED ORDER — LACTATED RINGERS IV SOLN: INTRAVENOUS | Status: DC

## 2024-11-17 MED ORDER — HYDROMORPHONE HCL 1 MG/ML IJ SOLN
0.5000 mg | INTRAMUSCULAR | Status: DC | PRN
  Administered 2024-11-17: 1 mg via INTRAVENOUS
  Filled 2024-11-17: qty 1

## 2024-11-17 MED ORDER — ROPIVACAINE HCL 5 MG/ML IJ SOLN
INTRAMUSCULAR | Status: DC | PRN
  Administered 2024-11-17: 20 mL via PERINEURAL

## 2024-11-17 MED ORDER — ALPRAZOLAM 0.25 MG PO TABS: 0.2500 mg | ORAL_TABLET | Freq: Two times a day (BID) | ORAL | Status: DC | PRN

## 2024-11-17 MED ORDER — RISAQUAD PO CAPS
1.0000 | ORAL_CAPSULE | Freq: Every day | ORAL | Status: DC
  Administered 2024-11-17 – 2024-11-19 (×3): 1 via ORAL
  Filled 2024-11-17 (×3): qty 1

## 2024-11-17 MED ORDER — PHENYLEPHRINE HCL-NACL 20-0.9 MG/250ML-% IV SOLN
INTRAVENOUS | Status: DC | PRN
  Administered 2024-11-17: 25 ug/min via INTRAVENOUS

## 2024-11-17 MED ORDER — METOCLOPRAMIDE HCL 5 MG PO TABS: 5.0000 mg | ORAL_TABLET | Freq: Three times a day (TID) | ORAL | Status: DC | PRN

## 2024-11-17 MED ORDER — ORAL CARE MOUTH RINSE: 15.0000 mL | Freq: Once | OROMUCOSAL | Status: AC

## 2024-11-17 MED ORDER — ONDANSETRON HCL 4 MG/2ML IJ SOLN: 4.0000 mg | Freq: Four times a day (QID) | INTRAMUSCULAR | Status: DC | PRN

## 2024-11-17 MED ORDER — ONDANSETRON HCL 4 MG/2ML IJ SOLN
INTRAMUSCULAR | Status: AC
  Filled 2024-11-17: qty 2

## 2024-11-17 MED ORDER — HYDROMORPHONE HCL 1 MG/ML IJ SOLN: 0.2500 mg | INTRAMUSCULAR | Status: DC | PRN

## 2024-11-17 MED ORDER — DOCUSATE SODIUM 100 MG PO CAPS
100.0000 mg | ORAL_CAPSULE | Freq: Two times a day (BID) | ORAL | Status: DC
  Administered 2024-11-17 – 2024-11-19 (×4): 100 mg via ORAL
  Filled 2024-11-17 (×4): qty 1

## 2024-11-17 MED ORDER — BISACODYL 5 MG PO TBEC: 5.0000 mg | DELAYED_RELEASE_TABLET | Freq: Every day | ORAL | Status: DC | PRN

## 2024-11-17 MED ORDER — CEFAZOLIN SODIUM-DEXTROSE 2-4 GM/100ML-% IV SOLN
2.0000 g | Freq: Four times a day (QID) | INTRAVENOUS | Status: AC
  Administered 2024-11-17 (×2): 2 g via INTRAVENOUS
  Filled 2024-11-17 (×2): qty 100

## 2024-11-17 MED ORDER — SODIUM CHLORIDE (PF) 0.9 % IJ SOLN
INTRAMUSCULAR | Status: DC | PRN
  Administered 2024-11-17: 60 mL

## 2024-11-17 MED ORDER — FENTANYL CITRATE (PF) 100 MCG/2ML IJ SOLN
INTRAMUSCULAR | Status: AC
  Filled 2024-11-17: qty 2

## 2024-11-17 MED ORDER — METOCLOPRAMIDE HCL 5 MG/ML IJ SOLN: 5.0000 mg | Freq: Three times a day (TID) | INTRAMUSCULAR | Status: DC | PRN

## 2024-11-17 MED ORDER — PANTOPRAZOLE SODIUM 40 MG PO TBEC
40.0000 mg | DELAYED_RELEASE_TABLET | Freq: Every day | ORAL | Status: DC
  Administered 2024-11-18 – 2024-11-19 (×2): 40 mg via ORAL
  Filled 2024-11-17 (×2): qty 1

## 2024-11-17 MED ORDER — TRANEXAMIC ACID-NACL 1000-0.7 MG/100ML-% IV SOLN
1000.0000 mg | INTRAVENOUS | Status: AC
  Administered 2024-11-17: 1000 mg via INTRAVENOUS
  Filled 2024-11-17: qty 100

## 2024-11-17 MED ORDER — SODIUM CHLORIDE 0.9 % IR SOLN
Status: DC | PRN
  Administered 2024-11-17: 1000 mL

## 2024-11-17 MED ORDER — CELECOXIB 200 MG PO CAPS
200.0000 mg | ORAL_CAPSULE | Freq: Every day | ORAL | Status: DC
  Administered 2024-11-17 – 2024-11-19 (×3): 200 mg via ORAL
  Filled 2024-11-17 (×3): qty 1

## 2024-11-17 MED ORDER — PROPOFOL 500 MG/50ML IV EMUL
INTRAVENOUS | Status: DC | PRN
  Administered 2024-11-17: 125 ug/kg/min via INTRAVENOUS

## 2024-11-17 MED ORDER — METHOCARBAMOL 1000 MG/10ML IJ SOLN: 500.0000 mg | Freq: Four times a day (QID) | INTRAMUSCULAR | Status: DC | PRN

## 2024-11-17 MED ORDER — VITAMIN D 25 MCG (1000 UNIT) PO TABS
1000.0000 [IU] | ORAL_TABLET | Freq: Every day | ORAL | Status: DC
  Administered 2024-11-17 – 2024-11-19 (×3): 1000 [IU] via ORAL
  Filled 2024-11-17 (×3): qty 1

## 2024-11-17 MED ORDER — VANCOMYCIN HCL IN DEXTROSE 1-5 GM/200ML-% IV SOLN
1000.0000 mg | INTRAVENOUS | Status: AC
  Administered 2024-11-17: 1000 mg via INTRAVENOUS
  Filled 2024-11-17: qty 200

## 2024-11-17 MED ORDER — BUPIVACAINE LIPOSOME 1.3 % IJ SUSP
INTRAMUSCULAR | Status: AC
  Filled 2024-11-17: qty 20

## 2024-11-17 MED ORDER — SODIUM CHLORIDE (PF) 0.9 % IJ SOLN
INTRAMUSCULAR | Status: AC
  Filled 2024-11-17: qty 40

## 2024-11-17 MED ORDER — BUPIVACAINE-EPINEPHRINE (PF) 0.25% -1:200000 IJ SOLN
INTRAMUSCULAR | Status: AC
  Filled 2024-11-17: qty 30

## 2024-11-17 MED ORDER — TRANEXAMIC ACID 650 MG PO TABS
1300.0000 mg | ORAL_TABLET | Freq: Once | ORAL | Status: AC
  Administered 2024-11-17: 1300 mg via ORAL
  Filled 2024-11-17: qty 2

## 2024-11-17 MED ORDER — ADULT MULTIVITAMIN W/MINERALS CH
1.0000 | ORAL_TABLET | Freq: Every day | ORAL | Status: DC
  Administered 2024-11-17 – 2024-11-19 (×3): 1 via ORAL
  Filled 2024-11-17 (×3): qty 1

## 2024-11-17 MED ORDER — 0.9 % SODIUM CHLORIDE (POUR BTL) OPTIME
TOPICAL | Status: DC | PRN
  Administered 2024-11-17: 1000 mL

## 2024-11-17 MED ORDER — MIDAZOLAM HCL 2 MG/2ML IJ SOLN
INTRAMUSCULAR | Status: AC
  Filled 2024-11-17: qty 2

## 2024-11-17 SURGICAL SUPPLY — 56 items
ATTUNE PS FEM LT SZ 5 CEM KNEE (Femur) IMPLANT
ATTUNE PSRP INSR SZ5 8 KNEE (Insert) IMPLANT
BAG COUNTER SPONGE SURGICOUNT (BAG) ×1 IMPLANT
BAG ZIPLOCK 12X15 (MISCELLANEOUS) IMPLANT
BASE TIBIAL ROT PLAT SZ 5 KNEE (Knees) IMPLANT
BLADE SAW SGTL 11.0X1.19X90.0M (BLADE) ×1 IMPLANT
BLADE SAW SGTL 13.0X1.19X90.0M (BLADE) ×1 IMPLANT
BLADE SURG SZ10 CARB STEEL (BLADE) ×2 IMPLANT
BNDG ELASTIC 4INX 5YD STR LF (GAUZE/BANDAGES/DRESSINGS) ×1 IMPLANT
BNDG ELASTIC 6INX 5YD STR LF (GAUZE/BANDAGES/DRESSINGS) ×1 IMPLANT
BNDG ELASTIC 6X10 VLCR STRL LF (GAUZE/BANDAGES/DRESSINGS) IMPLANT
BOWL SMART MIX CTS (DISPOSABLE) ×1 IMPLANT
CEMENT HV SMART SET (Cement) ×2 IMPLANT
COVER SURGICAL LIGHT HANDLE (MISCELLANEOUS) ×1 IMPLANT
CUFF TRNQT CYL 34X4.125X (TOURNIQUET CUFF) ×1 IMPLANT
DRAPE INCISE IOBAN 66X45 STRL (DRAPES) IMPLANT
DRAPE SHEET LG 3/4 BI-LAMINATE (DRAPES) ×1 IMPLANT
DRAPE SURG ORHT 6 SPLT 77X108 (DRAPES) ×2 IMPLANT
DRAPE TOP 10253 STERILE (DRAPES) ×1 IMPLANT
DRAPE U-SHAPE 47X51 STRL (DRAPES) ×1 IMPLANT
DRSG AQUACEL AG ADV 3.5X10 (GAUZE/BANDAGES/DRESSINGS) ×1 IMPLANT
DURAPREP 26ML APPLICATOR (WOUND CARE) ×1 IMPLANT
ELECT BLADE TIP CTD 4 INCH (ELECTRODE) ×1 IMPLANT
ELECT PENCIL ROCKER SW 15FT (MISCELLANEOUS) ×1 IMPLANT
ELECT REM PT RETURN 15FT ADLT (MISCELLANEOUS) ×1 IMPLANT
GLOVE BIOGEL PI IND STRL 7.0 (GLOVE) ×1 IMPLANT
GLOVE BIOGEL PI IND STRL 8 (GLOVE) ×1 IMPLANT
GLOVE SURG SS PI 7.0 STRL IVOR (GLOVE) ×1 IMPLANT
GLOVE SURG SS PI 8.0 STRL IVOR (GLOVE) ×2 IMPLANT
GOWN STRL REUS W/ TWL XL LVL3 (GOWN DISPOSABLE) ×2 IMPLANT
HEMOSTAT SPONGE AVITENE ULTRA (HEMOSTASIS) IMPLANT
HOLDER FOLEY CATH W/STRAP (MISCELLANEOUS) ×1 IMPLANT
IMMOBILIZER KNEE 20 THIGH 36 (SOFTGOODS) ×1 IMPLANT
KIT TURNOVER KIT A (KITS) ×1 IMPLANT
MANIFOLD NEPTUNE II (INSTRUMENTS) ×1 IMPLANT
NS IRRIG 1000ML POUR BTL (IV SOLUTION) IMPLANT
PACK TOTAL KNEE CUSTOM (KITS) ×1 IMPLANT
PATELLA MEDIAL ATTUN 35MM KNEE (Knees) IMPLANT
PIN STEINMAN FIXATION KNEE (PIN) IMPLANT
PROTECTOR NERVE ULNAR (MISCELLANEOUS) ×1 IMPLANT
SEALER BIPOLAR AQUA 6.0 (INSTRUMENTS) ×1 IMPLANT
SET HNDPC FAN SPRY TIP SCT (DISPOSABLE) ×1 IMPLANT
SOLUTION PRONTOSAN WOUND 350ML (IRRIGATION / IRRIGATOR) ×1 IMPLANT
SPIKE FLUID TRANSFER (MISCELLANEOUS) ×1 IMPLANT
STAPLER VISISTAT (STAPLE) IMPLANT
STRIP CLOSURE SKIN 1/2X4 (GAUZE/BANDAGES/DRESSINGS) IMPLANT
SUT BONE WAX W31G (SUTURE) IMPLANT
SUT MNCRL AB 4-0 PS2 18 (SUTURE) IMPLANT
SUT STRATAFIX PDS+ 0 24IN (SUTURE) ×1 IMPLANT
SUT VIC AB 1 CT1 27XBRD ANTBC (SUTURE) ×3 IMPLANT
SUT VIC AB 2-0 CT1 TAPERPNT 27 (SUTURE) ×3 IMPLANT
TRAY FOLEY MTR SLVR 16FR STAT (SET/KITS/TRAYS/PACK) ×1 IMPLANT
TUBE SUCTION HIGH CAP CLEAR NV (SUCTIONS) ×1 IMPLANT
WATER STERILE IRR 1000ML POUR (IV SOLUTION) ×2 IMPLANT
WIPE CHG 2% PREP (PERSONAL CARE ITEMS) ×1 IMPLANT
WRAP KNEE MAXI GEL POST OP (GAUZE/BANDAGES/DRESSINGS) ×1 IMPLANT

## 2024-11-17 NOTE — Transfer of Care (Signed)
 Immediate Anesthesia Transfer of Care Note  Patient: Denise Summers  Procedure(s) Performed: ARTHROPLASTY, KNEE, TOTAL (Left: Knee)  Patient Location: PACU  Anesthesia Type:Regional and Spinal  Level of Consciousness: awake, alert , and oriented  Airway & Oxygen Therapy: Patient Spontanous Breathing and Patient connected to face mask oxygen  Post-op Assessment: Report given to RN and Post -op Vital signs reviewed and stable  Post vital signs: Reviewed and stable  Last Vitals:  Vitals Value Taken Time  BP 99/48 11/17/24 10:51  Temp    Pulse 62 11/17/24 10:55  Resp 13 11/17/24 10:55  SpO2 100 % 11/17/24 10:55  Vitals shown include unfiled device data.  Last Pain:  Vitals:   11/17/24 9366  TempSrc: Oral         Complications: No notable events documented.

## 2024-11-17 NOTE — Discharge Instructions (Signed)

## 2024-11-17 NOTE — Anesthesia Preprocedure Evaluation (Addendum)
 Anesthesia Evaluation  Patient identified by MRN, date of birth, ID band Patient awake    Reviewed: Allergy & Precautions, NPO status , Patient's Chart, lab work & pertinent test results  Airway Mallampati: III  TM Distance: >3 FB Neck ROM: Full    Dental  (+) Dental Advisory Given   Pulmonary neg pulmonary ROS   breath sounds clear to auscultation       Cardiovascular hypertension,  Rhythm:Regular Rate:Normal     Neuro/Psych   Anxiety Depression Bipolar Disorder   negative neurological ROS     GI/Hepatic Neg liver ROS,GERD  ,,S/p gastric sleeve   Endo/Other  diabetesHypothyroidism    Renal/GU negative Renal ROS     Musculoskeletal  (+) Arthritis ,    Abdominal   Peds  Hematology negative hematology ROS (+)   Anesthesia Other Findings   Reproductive/Obstetrics                              Anesthesia Physical Anesthesia Plan  ASA: 3  Anesthesia Plan: Spinal   Post-op Pain Management: Regional block*   Induction: Intravenous  PONV Risk Score and Plan: 2 and Treatment may vary due to age or medical condition and Propofol  infusion  Airway Management Planned: Simple Face Mask  Additional Equipment: None  Intra-op Plan:   Post-operative Plan:   Informed Consent: I have reviewed the patients History and Physical, chart, labs and discussed the procedure including the risks, benefits and alternatives for the proposed anesthesia with the patient or authorized representative who has indicated his/her understanding and acceptance.     Dental advisory given  Plan Discussed with:   Anesthesia Plan Comments:          Anesthesia Quick Evaluation

## 2024-11-17 NOTE — Evaluation (Signed)
 Physical Therapy Evaluation Patient Details Name: Denise Summers MRN: [id number] DOB: 06-30-1971 Today's Date: 11/17/2024  History of Present Illness  Pt is 53 yo female s/p L TKA 12/3/255.  Pt with hx including but not limited to arthritis  Clinical Impression  Pt is s/p TKA resulting in the deficits listed below (see PT Problem List). At baseline, pt independent and working.  Has family support, accessible home, and DME.  Evaluation today limited due to orthostatic hypotension.  Pt was able to transfer to chair with min A but not able to progress further.  Expect will progress well once BP improves. Pt will benefit from acute skilled PT to increase their independence and safety with mobility to allow discharge.       Pt reports has had soft BP for past few weeks-months.  BP was 95/56 pre. Pt sat EOB and reports some hotness and nausea with dry heaves but not lightheaded - BP was 106/79 HR 55.  Pt was pale but denies lightheaded and motivated to get OOB.  Had chair close for pivot.  Upon standing and pivoting toward chair reports dizzy, completed pivot and had pt sit.  Pt very pale and diaphoretic.  BP in sitting was 75/36 with MAP 47 and HR 49.  Reclined pt fully immediately, had her perform ankle pumps and resisted chest press x 10, started SCD, called nurse.  After 3 mins up to 87/46 MAP 59 HR 51. RN present  Continued ankle pumps and 10x resisted chest press.  After 3 more mins up to 88/51 MAP 63 and after 3 more 92/51 and HR 53.  Pt reports feeling much better.  Had her stay reclined and discussed slowly returning to sitting and not getting up without assist.  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     If plan is discharge home, recommend the following: A little help with walking and/or transfers;A little help with bathing/dressing/bathroom;Assistance with cooking/housework;Help with stairs or ramp for entrance   Can travel by private vehicle         Equipment Recommendations None recommended by PT  Recommendations for Other Services       Functional Status Assessment Patient has had a recent decline in their functional status and demonstrates the ability to make significant improvements in function in a reasonable and predictable amount of time.     Precautions / Restrictions Precautions Precautions: Knee;Fall Restrictions Weight Bearing Restrictions Per Provider Order: Yes LLE Weight Bearing Per Provider Order: Weight bearing as tolerated      Mobility  Bed Mobility Overal bed mobility: Needs Assistance Bed Mobility: Supine to Sit     Supine to sit: Min assist          Transfers Overall transfer level: Needs assistance Equipment used: Rolling walker (2 wheels) Transfers: Sit to/from Stand, Bed to chair/wheelchair/BSC Sit to Stand: Min assist   Step pivot transfers: Min assist       General transfer comment: cues and light min A to stand, became lightheaded needing min A for stability to get to chair    Ambulation/Gait               General Gait Details: unable orthostatic  Stairs            Wheelchair Mobility     Tilt Bed    Modified Rankin (Stroke Patients Only)       Balance Overall balance assessment: Needs assistance Sitting-balance support: No  upper extremity supported Sitting balance-Leahy Scale: Good     Standing balance support: Bilateral upper extremity supported, Reliant on assistive device for balance Standing balance-Leahy Scale: Poor                               Pertinent Vitals/Pain Pain Assessment Pain Assessment: 0-10 Pain Score: 5  Pain Location: Lknee Pain Descriptors / Indicators: Discomfort Pain Intervention(s): Limited activity within patient's tolerance, Monitored during session, Premedicated before session, Repositioned    Home Living Family/patient expects to be discharged to:: Private residence Living Arrangements:  Spouse/significant other Available Help at Discharge: Family;Available 24 hours/day Type of Home: House Home Access: Stairs to enter Entrance Stairs-Rails: Right;Left;Can reach both Entrance Stairs-Number of Steps: 4   Home Layout: One level Home Equipment: Pharmacist, Hospital (2 wheels)      Prior Function Prior Level of Function : Independent/Modified Independent;Working/employed;Driving                     Extremity/Trunk Assessment   Upper Extremity Assessment Upper Extremity Assessment: Overall WFL for tasks assessed    Lower Extremity Assessment Lower Extremity Assessment: LLE deficits/detail;RLE deficits/detail RLE Deficits / Details: ROM WFL; MMT 5/5 LLE Deficits / Details: expected post op changes; ROM knee 5 to 85 degrees; MMT: ankle 5/5, knee and hip 3/5 LLE Sensation: WNL    Cervical / Trunk Assessment Cervical / Trunk Assessment: Normal  Communication        Cognition Arousal: Alert Behavior During Therapy: WFL for tasks assessed/performed   PT - Cognitive impairments: No apparent impairments                                 Cueing       General Comments      Exercises     Assessment/Plan    PT Assessment Patient needs continued PT services  PT Problem List Decreased strength;Decreased mobility;Decreased range of motion;Decreased activity tolerance;Decreased balance;Decreased knowledge of use of DME       PT Treatment Interventions DME instruction;Therapeutic exercise;Gait training;Stair training;Functional mobility training;Therapeutic activities;Patient/family education;Modalities;Balance training    PT Goals (Current goals can be found in the Care Plan section)  Acute Rehab PT Goals Patient Stated Goal: return home PT Goal Formulation: With patient/family Time For Goal Achievement: 12/01/24 Potential to Achieve Goals: Good    Frequency 7X/week     Co-evaluation               AM-PAC PT 6 Clicks  Mobility  Outcome Measure Help needed turning from your back to your side while in a flat bed without using bedrails?: A Little Help needed moving from lying on your back to sitting on the side of a flat bed without using bedrails?: A Little Help needed moving to and from a bed to a chair (including a wheelchair)?: A Little Help needed standing up from a chair using your arms (e.g., wheelchair or bedside chair)?: A Little Help needed to walk in hospital room?: Total Help needed climbing 3-5 steps with a railing? : Total 6 Click Score: 14    End of Session Equipment Utilized During Treatment: Gait belt Activity Tolerance: Patient tolerated treatment well Patient left: in chair;with call bell/phone within reach;with family/visitor present;with nursing/sitter in room;with SCD's reapplied (fully reclined, nurse present and starting fluids) Nurse Communication: Mobility status PT Visit Diagnosis: Other abnormalities of gait  and mobility (R26.89);Muscle weakness (generalized) (M62.81)    Time: 8495-8463 PT Time Calculation (min) (ACUTE ONLY): 32 min   Charges:   PT Evaluation $PT Eval Moderate Complexity: 1 Mod PT Treatments $Therapeutic Activity: 8-22 mins PT General Charges $$ ACUTE PT VISIT: 1 Visit         Benjiman, PT Acute Rehab [facility] [phone]   Benjiman VEAR Mulberry 11/17/2024, 4:51 PM

## 2024-11-17 NOTE — Anesthesia Procedure Notes (Signed)
 Spinal  Patient location during procedure: OR End time: 11/17/2024 8:38 AM Reason for block: surgical anesthesia Staffing Performed: resident/CRNA  Resident/CRNA: Dwana Winton BIRCH, CRNA Performed by: Janiya Millirons, Clinical Cytogeneticist D, CRNA Authorized by: Kaely Hollan, Clinical Cytogeneticist D, CRNA   Preanesthetic Checklist Completed: patient identified, IV checked, site marked, risks and benefits discussed, surgical consent, monitors and equipment checked, pre-op evaluation and timeout performed Spinal Block Patient position: sitting Prep: DuraPrep Patient monitoring: heart rate, continuous pulse ox and blood pressure Approach: midline Location: L3-4 Injection technique: single-shot Needle Needle type: Pencan  Needle gauge: 24 G Needle length: 9 cm Assessment Sensory level: T6 Events: CSF return

## 2024-11-17 NOTE — Interval H&P Note (Signed)
 History and Physical Interval Note:  11/17/2024 8:07 AM  Denise Summers  has presented today for surgery, with the diagnosis of degenerative joint disease left knee.  The various methods of treatment have been discussed with the patient and family. After consideration of risks, benefits and other options for treatment, the patient has consented to  Procedure(s): ARTHROPLASTY, KNEE, TOTAL (Left) as a surgical intervention.  The patient's history has been reviewed, patient examined, no change in status, stable for surgery.  I have reviewed the patient's chart and labs.  Questions were answered to the patient's satisfaction.   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  Recommended preoperative vancomycin therefore.  Reyes JAYSON Billing

## 2024-11-17 NOTE — Interval H&P Note (Signed)
 History and Physical Interval Note:  11/17/2024 8:09 AM  Denise Summers  has presented today for surgery, with the diagnosis of degenerative joint disease left knee.  The various methods of treatment have been discussed with the patient and family. After consideration of risks, benefits and other options for treatment, the patient has consented to  Procedure(s): ARTHROPLASTY, KNEE, TOTAL (Left) as a surgical intervention.  The patient's history has been reviewed, patient examined, no change in status, stable for surgery.  I have reviewed the patient's chart and labs.  Questions were answered to the patient's satisfaction.   Patient is a dialysis nurse with multiple MRSA exposures.  Reyes JAYSON Billing

## 2024-11-17 NOTE — Op Note (Unsigned)
 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  PROCEDURE PERFORMED:  Left total knee arthroplasty utilizing a DePuy Attune rotating platform.  We used a 5 femur and a 5 tibia 8 mm insert, 35 patella.  ANESTHESIA:  Spinal.  ASSISTANT:  Arlyne Randy PA.  HISTORY:  This is a 53 year old female with progressive collapse in the medial compartment due to osteoarthritis indicated for replacement of the degenerated joint bone on bone.  Risk and benefits discussed including bleeding, infection, damage to  neurovascular structures, no change in symptoms, worsening symptoms, DVT and PE and anesthetic complications, etc.  DESCRIPTION OF PROCEDURE:  The patient in supine position after induction of adequate spinal anesthesia.  2 g Kefzol and vancomycin preoperatively for antimicrobial prophylaxis.  The left lower extremity was prepped and draped and exsanguinated in usual  sterile fashion.  Thigh tourniquet inflated to 225 mmHg.  Midline incision was then made over the knee.  Full thickness flaps developed.  Medial parapatellar arthrotomy was performed.  Elevated soft tissues medially preserving the MCL.  Patella gently  everted.  Knee was flexed.  End-stage osteoarthritis noted patellofemoral joint and in medial compartment.  Remnants of medial and lateral menisci were excised.  Fat pad was debrided.  A notch was placed above the femoral notch as a starting hole for the  femoral drill which was drilled in line with the femur entering without difficulty.  This was then irrigated, confirmed with a T-handle, inserted an intramedullary guide with 8 off the distal  femur due to hyperlaxity.  This was then pinned and I  performed the distal femoral cut.  I then subluxed the tibia.  The low side was medially using an external alignment guide.  Took off a defect medially which was 9 laterally.  Bisecting the tibiotalar joint 3 degrees slope parallel to the shaft, pinned  and performed the cut protecting the tissues posteriorly at all times.  The extension gap was noted to be satisfactory with an 8.  I then flexed the knee, sized the femur off the anterior cortex to be a 5 with 3 degrees of external rotation, pinned and  performed our distal femoral cut.  Anterior, posteriorly and chamfer cuts without difficulty.  I then subluxed the tibia, removed osteophytes.  Sized to a 5 maximizing the coverage just to the medial third of the tibial tubercle.  This was pinned.   Harvested bone centrally, packed it into the distal femur.  Drilled centrally utilizing our punch guide.  Then we returned attention back to the femur to perform our box cut bisecting the canal, pinned and performed our box cut.  A trial femur was  impacted.  It sat flush.  Drilled our peg holes.  Placed a 7 and then an 8 mm insert, which was optimal.  Full extension, full flexion, good stability with varus valgus stressing at 0-30 degrees and negative anterior drawer.  Everted the patella,  measured to a 24, planed it to a 15 with a patellar guide.  Measured to a 35 medializing it.  Paddle parallel to the joint surface drilled our lug holes.  Placed a trial patellar component and reduced it.  Slight lateral tracking, we performed a lateral  release  with perfect patellofemoral tracking following that.  All instrumentation was removed.  We checked posteriorly.  Any remnants of the menisci were excised.  The capsule and popliteus was intact.  Cauterized geniculates with an Aquamantys.  Then  used Exparel  through the posterior medial capsule aspirating first without a break in the vacuum and injecting 20 mL.   Anesthetizing the medial lateral gutter, the periosteum of the femur, quadricep, proximal tibia, etc.  Then pulsatile lavage thoroughly cleaned all bony surfaces.  Cement was mixed on the back table under centrifuge and vacuum.  The knee flexed, thoroughly dried all surfaces.  Drilled holes in the eburnated bone in the proximal tibia.  I then used cement  and digitally pressurized it in the proximal tibia.  Cement was placed on the tibial component and impacted into place with the redundant cement removed.  Cement placed on the femur and the femoral component impacted into place with the redundant cement  removed.  The femur sat flush.  Placed an 8 insert, reduced the knee into full extension, held in axial load throughout the curing of the cement, I cemented and clamped the patella.  Marcaine  with epinephrine  followed by Prontosan was placed in the wound  during the curing of the cement.  Which occurred at 55 minutes.  Tourniquet was deflated.  Any bleeding was cauterized with an Aquamantys.  Bone wax on cancellous surfaces.  Insert removed and I meticulously removed all redundant cement.  Following  that, I copiously irrigated with pulsatile lavage followed by Prontosan and placed the 8 permanent insert without difficulty and again in full extension, full flexion, good stability with varus valgus stressing at 0 and 30 degrees, negative anterior  drawer.  In mid-flexion, I reapproximated the patellar arthrotomy and closed it with number 1 Vicryl interrupted figure-of-eight sutures followed by a running Stratafix.  Excellent patellofemoral tracking following this.  Irrigated copiously once again  and with Prontosan.  Subcu with 2-0 and skin with staples irrigating with Prontosan throughout.  Sterile dressing applied, placed in an ACE and a immobilizer and then transported to the recovery room in satisfactory condition.  Patient tolerated the procedure well.  No complications.  Assistant Arlyne Randy, [place] was used throughout the case for patient positioning, exposure and closure.  BLOOD LOSS:  50 mL.   PUS D: 11/17/2024 10:36:15 am T: 11/17/2024 10:50:00 am  JOB: [id number]/ [id number]

## 2024-11-17 NOTE — Plan of Care (Signed)
 ?  Problem: Clinical Measurements: ?Goal: Will remain free from infection ?Outcome: Progressing ?  ?

## 2024-11-17 NOTE — Anesthesia Procedure Notes (Signed)
 Anesthesia Regional Block: Adductor canal block   Pre-Anesthetic Checklist: , timeout performed,  Correct Patient, Correct Site, Correct Laterality,  Correct Procedure, Correct Position, site marked,  Risks and benefits discussed,  Surgical consent,  Pre-op evaluation,  At surgeon's request and post-op pain management  Laterality: Left  Prep: chloraprep       Needles:  Injection technique: Single-shot  Needle Type: Stimiplex     Needle Length: 9cm  Needle Gauge: 21     Additional Needles:   Procedures:,,,, ultrasound used (permanent image in chart),,    Narrative:  Start time: 11/17/2024 8:21 AM End time: 11/17/2024 8:26 AM Injection made incrementally with aspirations every 5 mL.  Performed by: Personally  Anesthesiologist: Cleotilde Butler Dade, MD

## 2024-11-17 NOTE — Brief Op Note (Signed)
 11/17/2024  8:10 AM  PATIENT:  Denise Summers  53 y.o. female  PRE-OPERATIVE DIAGNOSIS:  degenerative joint disease left knee  POST-OPERATIVE DIAGNOSIS:  degenerative joint disease left knee  PROCEDURE:  Procedure(s): ARTHROPLASTY, KNEE, TOTAL (Left)  SURGEON:  Surgeons and Role:    DEWAINE Duwayne Purchase, MD - Primary  PHYSICIAN ASSISTANT:   ASSISTANTS: Bissell   ANESTHESIA:   spinal  EBL:  50cc  BLOOD ADMINISTERED:none The aquamantis was utilized for this case to help facilitate better hemostasis as patient was felt to be at increased risk of bleeding because of recent anticoagulation use.   DRAINS: none   LOCAL MEDICATIONS USED:  MARCAINE      SPECIMEN:  No Specimen  DISPOSITION OF SPECIMEN:  N/A  COUNTS:  YES  TOURNIQUET:  55 min   DICTATION: .Other Dictation: Dictation Number [id number]  PLAN OF CARE: Admit for overnight observation  PATIENT DISPOSITION:  PACU - hemodynamically stable.   Delay start of Pharmacological VTE agent (>24hrs) due to surgical blood loss or risk of bleeding: no

## 2024-11-18 ENCOUNTER — Encounter (HOSPITAL_COMMUNITY): Payer: Self-pay | Admitting: Specialist

## 2024-11-18 LAB — CBC
HCT: 32.5 % — ABNORMAL LOW (ref 36.0–46.0)
Hemoglobin: 10.5 g/dL — ABNORMAL LOW (ref 12.0–15.0)
MCH: 32.1 pg (ref 26.0–34.0)
MCHC: 32.3 g/dL (ref 30.0–36.0)
MCV: 99.4 fL (ref 80.0–100.0)
Platelets: 230 K/uL (ref 150–400)
RBC: 3.27 MIL/uL — ABNORMAL LOW (ref 3.87–5.11)
RDW: 13.1 % (ref 11.5–15.5)
WBC: 8.9 K/uL (ref 4.0–10.5)
nRBC: 0 % (ref 0.0–0.2)

## 2024-11-18 LAB — BASIC METABOLIC PANEL WITH GFR
Anion gap: 7 (ref 5–15)
BUN: 10 mg/dL (ref 6–20)
CO2: 26 mmol/L (ref 22–32)
Calcium: 8.3 mg/dL — ABNORMAL LOW (ref 8.9–10.3)
Chloride: 104 mmol/L (ref 98–111)
Creatinine, Ser: 0.69 mg/dL (ref 0.44–1.00)
GFR, Estimated: >60 mL/min (ref >60)
Glucose, Bld: 104 mg/dL — ABNORMAL HIGH (ref 70–99)
Potassium: 4.5 mmol/L (ref 3.5–5.1)
Sodium: 137 mmol/L (ref 135–145)

## 2024-11-18 MED ORDER — KETOROLAC TROMETHAMINE 30 MG/ML IJ SOLN
30.0000 mg | Freq: Once | INTRAMUSCULAR | Status: AC
  Administered 2024-11-18: 30 mg via INTRAVENOUS
  Filled 2024-11-18: qty 1

## 2024-11-18 MED ORDER — SODIUM CHLORIDE 0.9 % IV BOLUS
500.0000 mL | Freq: Once | INTRAVENOUS | Status: AC
  Administered 2024-11-18: 500 mL via INTRAVENOUS

## 2024-11-18 MED ORDER — HYDROMORPHONE HCL 2 MG PO TABS
2.0000 mg | ORAL_TABLET | ORAL | Status: DC | PRN
  Administered 2024-11-18: 4 mg via ORAL
  Administered 2024-11-18: 2 mg via ORAL
  Administered 2024-11-19 (×3): 4 mg via ORAL
  Filled 2024-11-18 (×2): qty 2
  Filled 2024-11-18: qty 1
  Filled 2024-11-18: qty 2
  Filled 2024-11-18 (×2): qty 1

## 2024-11-18 NOTE — Progress Notes (Signed)
 Physical Therapy Treatment Patient Details Name: Denise Summers MRN: [id number] DOB: 1971-06-15 Today's Date: 11/18/2024   History of Present Illness Pt is 53 yo female s/p L TKA 12/3/255.  Pt with hx including but not limited to arthritis    PT Comments  Pt continues motivated but progressing slowly with mobility 2* ongoing orthostatic hypotension.  BP supine 100/64; sit 96/52; stand 98/57 and after ambulating ~ 20' - 86/46 with increasing dizziness - RN aware.   If plan is discharge home, recommend the following: A little help with walking and/or transfers;A little help with bathing/dressing/bathroom;Assistance with cooking/housework;Help with stairs or ramp for entrance   Can travel by private vehicle        Equipment Recommendations  None recommended by PT    Recommendations for Other Services       Precautions / Restrictions Precautions Precautions: Knee;Fall Restrictions Weight Bearing Restrictions Per Provider Order: No LLE Weight Bearing Per Provider Order: Weight bearing as tolerated     Mobility  Bed Mobility Overal bed mobility: Needs Assistance Bed Mobility: Supine to Sit     Supine to sit: Min assist     General bed mobility comments: cues for sequence and use of R LE to self assist    Transfers Overall transfer level: Needs assistance Equipment used: Rolling walker (2 wheels) Transfers: Sit to/from Stand, Bed to chair/wheelchair/BSC Sit to Stand: Min assist           General transfer comment: cues for LE management and use of UEs to self assist    Ambulation/Gait Ambulation/Gait assistance: Min assist Gait Distance (Feet): 21 Feet Assistive device: Rolling walker (2 wheels) Gait Pattern/deviations: Step-to pattern, Decreased step length - right, Decreased step length - left, Shuffle, Trunk flexed Gait velocity: decr     General Gait Details: cues for sequence, posture and position from [employer]             Wheelchair Mobility      Tilt Bed    Modified Rankin (Stroke Patients Only)       Balance Overall balance assessment: Needs assistance Sitting-balance support: No upper extremity supported Sitting balance-Leahy Scale: Good     Standing balance support: Bilateral upper extremity supported, Reliant on assistive device for balance Standing balance-Leahy Scale: Poor                              Communication Communication Communication: No apparent difficulties  Cognition Arousal: Alert Behavior During Therapy: WFL for tasks assessed/performed   PT - Cognitive impairments: No apparent impairments                         Following commands: Intact      Cueing    Exercises      General Comments        Pertinent Vitals/Pain Pain Assessment Pain Assessment: 0-10 Pain Score: 5  Pain Location: Lknee Pain Descriptors / Indicators: Discomfort Pain Intervention(s): Limited activity within patient's tolerance, Monitored during session, Premedicated before session, Ice applied    Home Living Family/patient expects to be discharged to:: Private residence Living Arrangements: Spouse/significant other                      Prior Function            PT Goals (current goals can now be found in the care plan section) Acute Rehab PT  Goals Patient Stated Goal: return home PT Goal Formulation: With patient/family Time For Goal Achievement: 12/01/24 Potential to Achieve Goals: Good Progress towards PT goals: Progressing toward goals    Frequency    7X/week      PT Plan      Co-evaluation              AM-PAC PT 6 Clicks Mobility   Outcome Measure  Help needed turning from your back to your side while in a flat bed without using bedrails?: A Little Help needed moving from lying on your back to sitting on the side of a flat bed without using bedrails?: A Little Help needed moving to and from a bed to a chair (including a wheelchair)?: A Little Help  needed standing up from a chair using your arms (e.g., wheelchair or bedside chair)?: A Little Help needed to walk in hospital room?: A Little Help needed climbing 3-5 steps with a railing? : Total 6 Click Score: 16    End of Session Equipment Utilized During Treatment: Gait belt Activity Tolerance: Other (comment) (orthostatic) Patient left: in chair;with call bell/phone within reach;with nursing/sitter in room;with SCD's reapplied Nurse Communication: Mobility status PT Visit Diagnosis: Other abnormalities of gait and mobility (R26.89);Muscle weakness (generalized) (M62.81)     Time: 1005-1035 PT Time Calculation (min) (ACUTE ONLY): 30 min  Charges:    $Gait Training: 8-22 mins $Therapeutic Exercise: 8-22 mins PT General Charges $$ ACUTE PT VISIT: 1 Visit                     [facility] PT Acute Rehabilitation Services Office [phone]    Satoria Dunlop 11/18/2024, 4:20 PM

## 2024-11-18 NOTE — Plan of Care (Signed)
  Problem: Coping: Goal: Level of anxiety will decrease Outcome: Progressing   Problem: Safety: Goal: Ability to remain free from injury will improve Outcome: Progressing   Problem: Pain Managment: Goal: General experience of comfort will improve and/or be controlled Outcome: Progressing

## 2024-11-18 NOTE — Progress Notes (Signed)
 Subjective: 1 Day Post-Op Procedure(s) (LRB): ARTHROPLASTY, KNEE, TOTAL (Left) Patient reports pain as 4 on 0-10 scale.   Denies CP or SOB.  Voiding without difficulty. Positive flatus. Objective: Vital signs in last 24 hours: Temp:  [97.6 F (36.4 C)-98.2 F (36.8 C)] 98.1 F (36.7 C) (12/04 0639) Pulse Rate:  [42-82] 69 (12/04 0639) Resp:  [11-18] 16 (12/04 0639) BP: (87-114)/(46-70) 98/49 (12/04 0639) SpO2:  [96 %-100 %] 97 % (12/04 0639)  Intake/Output from previous day: 12/03 0701 - 12/04 0700 In: 3446.6 [P.O.:840; I.V.:2406.6; IV Piggyback:200] Out: 2325 [Urine:2275; Blood:50] Intake/Output this shift: No intake/output data recorded.  Recent Labs    11/18/24 0329  HGB 10.5*   Recent Labs    11/18/24 0329  WBC 8.9  RBC 3.27*  HCT 32.5*  PLT 230   Recent Labs    11/18/24 0329  NA 137  K 4.5  CL 104  CO2 26  BUN 10  CREATININE 0.69  GLUCOSE 104*  CALCIUM 8.3*   No results for input(s): LABPT, INR in the last 72 hours.  Neurologically intact Neurovascular intact Sensation intact distally Dorsiflexion/Plantar flexion intact Incision: dressing C/D/I No cellulitis present No DVT  Assessment/Plan:  1 Day Post-Op Procedure(s) (LRB): ARTHROPLASTY, KNEE, TOTAL (Left) Advance diet Up with therapy Change from oxy to dilaudid PO 1 dose toradol  Keep through tomorrow Anticipate d/c tomorrow if doing better with pain control and PT  Anticipated LOS equal to or greater than 2 midnights due to - Age 53 and older with one or more of the following:  - Obesity  - Expected need for hospital services (PT, OT, Nursing) required for safe  discharge  - Anticipated need for postoperative skilled nursing care or inpatient rehab  - Active co-morbidities: None OR   - Unanticipated findings during/Post Surgery: Slow post-op progression: GI, pain control, mobility  - Patient is a high risk of re-admission due to: None   Principal Problem:   Left knee DJD       Reyes JAYSON Billing 11/18/2024, @NOW 

## 2024-11-18 NOTE — Progress Notes (Signed)
 Pt working with Physical therapy originally BP 100/64 and when they began walking, pt reports feeling dizzy and hot. Pts BP per PT dropped to 86/46. Pt back in chair in room, says symptoms are improving, BP 94/53, HR 74, O2 99. Jacylyn Bissell, PA-C notified. Verbal order given for Normal saline fluid bolus to be ran over 1 hour.

## 2024-11-18 NOTE — TOC Transition Note (Signed)
 Transition of Care [facility]) - Discharge Note   Patient Details  Name: Denise Summers MRN: [id number] Date of Birth: 17-Jan-1971  [facility] CM/SW Contact:  NORMAN ASPEN, LCSW Phone Number: 11/18/2024, 9:47 AM   Clinical Narrative:     Met with pt who confirms she has needed DME in the home.  HHPT prearranged with [facility] via ortho MD office prior to surgery.  No further IP CM needs.  Final next level of care: Home w Home Health Services Barriers to Discharge: No Barriers Identified   Patient Goals and CMS Choice Patient states their goals for this hospitalization and ongoing recovery are:: return home          Discharge Placement                       Discharge Plan and Services Additional resources added to the After Visit Summary for                  DME Arranged: N/A DME Agency: NA       HH Arranged: PT HH Agency: [facility]        Social Drivers of Health (SDOH) Interventions SDOH Screenings   Food Insecurity: No Food Insecurity (11/17/2024)  Housing: Low Risk  (11/17/2024)  Transportation Needs: No Transportation Needs (11/17/2024)  Utilities: Not At Risk (11/17/2024)  Depression (PHQ2-9): Low Risk  (02/24/2024)  Social Connections: Unknown (04/30/2022)   Received from [facility]  Tobacco Use: Low Risk  (11/17/2024)     Readmission Risk Interventions     No data to display

## 2024-11-18 NOTE — Anesthesia Postprocedure Evaluation (Signed)
 Anesthesia Post Note  Patient: Denise Summers  Procedure(s) Performed: ARTHROPLASTY, KNEE, TOTAL (Left: Knee)     Patient location during evaluation: PACU Anesthesia Type: Spinal Level of consciousness: awake and alert Pain management: pain level controlled Vital Signs Assessment: post-procedure vital signs reviewed and stable Respiratory status: spontaneous breathing, nonlabored ventilation and respiratory function stable Cardiovascular status: blood pressure returned to baseline and stable Postop Assessment: no apparent nausea or vomiting Anesthetic complications: no   No notable events documented.  Last Vitals:  Vitals:   11/18/24 0200 11/18/24 0639  BP: (!) 93/57 (!) 98/49  Pulse: 72 69  Resp: 18 16  Temp: 36.8 C 36.7 C  SpO2: 97% 97%    Last Pain:  Vitals:   11/18/24 0512  TempSrc:   PainSc: Asleep                 Butler Levander Pinal

## 2024-11-19 LAB — CBC
HCT: 30.2 % — ABNORMAL LOW (ref 36.0–46.0)
Hemoglobin: 9.7 g/dL — ABNORMAL LOW (ref 12.0–15.0)
MCH: 32.3 pg (ref 26.0–34.0)
MCHC: 32.1 g/dL (ref 30.0–36.0)
MCV: 100.7 fL — ABNORMAL HIGH (ref 80.0–100.0)
Platelets: 181 K/uL (ref 150–400)
RBC: 3 MIL/uL — ABNORMAL LOW (ref 3.87–5.11)
RDW: 13.2 % (ref 11.5–15.5)
WBC: 5.4 K/uL (ref 4.0–10.5)
nRBC: 0 % (ref 0.0–0.2)

## 2024-11-19 LAB — BASIC METABOLIC PANEL WITH GFR
Anion gap: 5 (ref 5–15)
BUN: 8 mg/dL (ref 6–20)
CO2: 28 mmol/L (ref 22–32)
Calcium: 8.3 mg/dL — ABNORMAL LOW (ref 8.9–10.3)
Chloride: 102 mmol/L (ref 98–111)
Creatinine, Ser: 0.51 mg/dL (ref 0.44–1.00)
GFR, Estimated: >60 mL/min (ref >60)
Glucose, Bld: 109 mg/dL — ABNORMAL HIGH (ref 70–99)
Potassium: 4.2 mmol/L (ref 3.5–5.1)
Sodium: 135 mmol/L (ref 135–145)

## 2024-11-19 MED ORDER — METHOCARBAMOL 500 MG PO TABS: 500.0000 mg | ORAL_TABLET | Freq: Three times a day (TID) | ORAL | 1 refills | Status: AC | PRN

## 2024-11-19 MED ORDER — OXYCODONE HCL 5 MG PO TABS: 5.0000 mg | ORAL_TABLET | ORAL | 0 refills | Status: AC | PRN

## 2024-11-19 MED ORDER — POLYETHYLENE GLYCOL 3350 17 G PO PACK: 17.0000 g | PACK | Freq: Every day | ORAL | 0 refills | Status: AC

## 2024-11-19 MED ORDER — ASPIRIN 81 MG PO TBEC: 81.0000 mg | DELAYED_RELEASE_TABLET | Freq: Two times a day (BID) | ORAL | 1 refills | Status: AC

## 2024-11-19 MED ORDER — DOCUSATE SODIUM 100 MG PO CAPS: 100.0000 mg | ORAL_CAPSULE | Freq: Two times a day (BID) | ORAL | 2 refills | Status: AC

## 2024-11-19 NOTE — Progress Notes (Addendum)
 Subjective: 2 Days Post-Op Procedure(s) (LRB): ARTHROPLASTY, KNEE, TOTAL (Left) Patient reports pain as 4 on 0-10 scale.   Denies CP or SOB.  Voiding without difficulty. Positive flatus. Objective: Vital signs in last 24 hours: Temp:  [97.7 F (36.5 C)-99.6 F (37.6 C)] 98.2 F (36.8 C) (12/05 0555) Pulse Rate:  [60-86] 84 (12/05 0555) Resp:  [16-18] 18 (12/05 0555) BP: (94-124)/(51-71) 124/65 (12/05 0555) SpO2:  [97 %-100 %] 100 % (12/05 0555) Weight:  [64.9 kg] 64.9 kg (12/04 1442)  Intake/Output from previous day: 12/04 0701 - 12/05 0700 In: 1150.7 [P.O.:440; I.V.:217.3; IV Piggyback:493.4] Out: -  Intake/Output this shift: No intake/output data recorded.  Recent Labs    11/18/24 0329 11/19/24 0332  HGB 10.5* 9.7*   Recent Labs    11/18/24 0329 11/19/24 0332  WBC 8.9 5.4  RBC 3.27* 3.00*  HCT 32.5* 30.2*  PLT 230 181   Recent Labs    11/18/24 0329 11/19/24 0332  NA 137 135  K 4.5 4.2  CL 104 102  CO2 26 28  BUN 10 8  CREATININE 0.69 0.51  GLUCOSE 104* 109*  CALCIUM 8.3* 8.3*   No results for input(s): LABPT, INR in the last 72 hours.  Neurologically intact Neurovascular intact Intact pulses distally Dorsiflexion/Plantar flexion intact Incision: scant drainage Compartment soft  Assessment/Plan:  2 Days Post-Op Procedure(s) (LRB): ARTHROPLASTY, KNEE, TOTAL (Left) Discharge home with home health   Principal Problem:   Left knee DJD      Denise Summers Billing 11/19/2024, @NOW 

## 2024-11-19 NOTE — Progress Notes (Addendum)
 Physical Therapy Treatment Patient Details Name: Denise Summers MRN: [id number] DOB: [birth year] Today's Date: 11/19/2024   History of Present Illness Pt is 53 yo female s/p L TKA 12/3/255.  Pt with hx including but not limited to arthritis    PT Comments  Pt feeling much better and progressing well with mobility.  Pt performed HEP with assist, reviewed KI don/doff; and up to ambulate increased distance in hall.  Pt denies symptoms but with BP supine 116/70; sitting 120/75; standing 117/67; and after walking 110/65.  Pt hopeful for dc home this date.    If plan is discharge home, recommend the following: A little help with walking and/or transfers;A little help with bathing/dressing/bathroom;Assistance with cooking/housework;Help with stairs or ramp for entrance   Can travel by private vehicle        Equipment Recommendations  None recommended by PT    Recommendations for Other Services       Precautions / Restrictions Precautions Precautions: Knee;Fall Restrictions Weight Bearing Restrictions Per Provider Order: No LLE Weight Bearing Per Provider Order: Weight bearing as tolerated     Mobility  Bed Mobility Overal bed mobility: Needs Assistance Bed Mobility: Supine to Sit     Supine to sit: Min assist     General bed mobility comments: cues for sequence and use of R LE to self assist    Transfers Overall transfer level: Needs assistance Equipment used: Rolling walker (2 wheels) Transfers: Sit to/from Stand Sit to Stand: Contact guard assist           General transfer comment: cues for LE management and use of UEs to self assist    Ambulation/Gait Ambulation/Gait assistance: Contact guard assist Gait Distance (Feet): 62 Feet Assistive device: Rolling walker (2 wheels) Gait Pattern/deviations: Step-to pattern, Decreased step length - right, Decreased step length - left, Shuffle, Trunk flexed Gait velocity: decr     General Gait Details: cues for sequence,  posture, stride length and position from [employer]             Wheelchair Mobility     Tilt Bed    Modified Rankin (Stroke Patients Only)       Balance Overall balance assessment: Needs assistance Sitting-balance support: No upper extremity supported Sitting balance-Leahy Scale: Good     Standing balance support: Single extremity supported Standing balance-Leahy Scale: Poor                              Communication Communication Communication: No apparent difficulties  Cognition Arousal: Alert Behavior During Therapy: WFL for tasks assessed/performed   PT - Cognitive impairments: No apparent impairments                         Following commands: Intact      Cueing Cueing Techniques: Verbal cues  Exercises  Total Joint Exercises Ankle Circles/Pumps: AROM, Both, 15 reps, Supine Quad Sets: AROM, Both, 10 reps, Supine Heel Slides: AAROM, Left, 15 reps, Supine Straight Leg Raises: AAROM, Left, 15 reps, Supine    General Comments        Pertinent Vitals/Pain Pain Assessment Pain Assessment: 0-10 Pain Score: 7  Pain Location: Lknee Pain Descriptors / Indicators: Aching, Sore, Burning Pain Intervention(s): Limited activity within patient's tolerance, Monitored during session, Premedicated before session, Ice applied    Home Living  Prior Function            PT Goals (current goals can now be found in the care plan section) Acute Rehab PT Goals Patient Stated Goal: return home PT Goal Formulation: With patient/family Time For Goal Achievement: 12/01/24 Potential to Achieve Goals: Good Progress towards PT goals: Progressing toward goals    Frequency    7X/week      PT Plan      Co-evaluation              AM-PAC PT 6 Clicks Mobility   Outcome Measure  Help needed turning from your back to your side while in a flat bed without using bedrails?: A Little Help needed  moving from lying on your back to sitting on the side of a flat bed without using bedrails?: A Little Help needed moving to and from a bed to a chair (including a wheelchair)?: A Little Help needed standing up from a chair using your arms (e.g., wheelchair or bedside chair)?: A Little Help needed to walk in hospital room?: A Little Help needed climbing 3-5 steps with a railing? : A Lot 6 Click Score: 17    End of Session Equipment Utilized During Treatment: Gait belt Activity Tolerance: Patient tolerated treatment well Patient left: in chair;with call bell/phone within reach Nurse Communication: Mobility status PT Visit Diagnosis: Other abnormalities of gait and mobility (R26.89);Muscle weakness (generalized) (M62.81)     Time: 9073-8989 PT Time Calculation (min) (ACUTE ONLY): 44 min  Charges:    $Gait Training: 8-22 mins $Therapeutic Exercise: 8-22 mins $Therapeutic Activity: 8-22 mins PT General Charges $$ ACUTE PT VISIT: 1 Visit                     [facility] PT Acute Rehabilitation Services Office [phone]    Jamiel Goncalves 11/19/2024, 11:08 AM

## 2024-11-19 NOTE — Progress Notes (Incomplete)
 Physical Therapy Treatment Patient Details Name: Denise Summers MRN: [id number] DOB: [birth year] Today's Date: 11/19/2024   History of Present Illness Pt is 53 yo female s/p L TKA 12/3/255.  Pt with hx including but not limited to arthritis    PT Comments  Pt continues very motivated and progressing well with mobility.  Pt reviewed written HEP, up to bathroom for toileting, ambulated increased distance in hall and negotiated stairs.  Pt with no c/o dizziness and with BP at session beginning 117/68 and at session end 116/56.  RN aware.  Pt eager for dc home this date.    If plan is discharge home, recommend the following: A little help with walking and/or transfers;A little help with bathing/dressing/bathroom;Assistance with cooking/housework;Help with stairs or ramp for entrance   Can travel by private vehicle        Equipment Recommendations  None recommended by PT    Recommendations for Other Services       Precautions / Restrictions Precautions Precautions: Knee;Fall Restrictions Weight Bearing Restrictions Per Provider Order: No LLE Weight Bearing Per Provider Order: Weight bearing as tolerated     Mobility  Bed Mobility Overal bed mobility: Needs Assistance Bed Mobility: Supine to Sit     Supine to sit: Supervision     General bed mobility comments: cues for sequence and use of R LE to self assist    Transfers Overall transfer level: Needs assistance Equipment used: Rolling walker (2 wheels) Transfers: Sit to/from Stand Sit to Stand: Contact guard assist, Supervision           General transfer comment: cues for LE management and use of UEs to self assist    Ambulation/Gait Ambulation/Gait assistance: Contact guard assist, Supervision Gait Distance (Feet): 85 Feet (an 15' into bathroom) Assistive device: Rolling walker (2 wheels) Gait Pattern/deviations: Step-to pattern, Decreased step length - right, Decreased step length - left, Shuffle, Trunk flexed Gait  velocity: decr     General Gait Details: min cues for sequence, posture, stride length and position from RW   Stairs Stairs: Yes Stairs assistance: Min assist Stair Management: Two rails, Step to pattern, Forwards Number of Stairs: 5 General stair comments: 2+3 steps with steady assist and cues for sequence   Wheelchair Mobility     Tilt Bed    Modified Rankin (Stroke Patients Only)       Balance Overall balance assessment: Needs assistance Sitting-balance support: No upper extremity supported Sitting balance-Leahy Scale: Good     Standing balance support: No upper extremity supported Standing balance-Leahy Scale: Fair                              Hotel Manager: No apparent difficulties  Cognition Arousal: Alert Behavior During Therapy: WFL for tasks assessed/performed   PT - Cognitive impairments: No apparent impairments                         Following commands: Intact      Cueing Cueing Techniques: Verbal cues  Exercises      General Comments        Pertinent Vitals/Pain Pain Assessment Pain Assessment: 0-10 Pain Score: 4  Pain Location: Lknee Pain Descriptors / Indicators: Aching, Sore, Burning Pain Intervention(s): Limited activity within patient's tolerance, Monitored during session, Premedicated before session, Ice applied    Home Living  Prior Function            PT Goals (current goals can now be found in the care plan section) Acute Rehab PT Goals Patient Stated Goal: return home PT Goal Formulation: With patient/family Time For Goal Achievement: 12/01/24 Potential to Achieve Goals: Good Progress towards PT goals: Progressing toward goals    Frequency    7X/week      PT Plan      Co-evaluation              AM-PAC PT 6 Clicks Mobility   Outcome Measure  Help needed turning from your back to your side while in a flat bed  without using bedrails?: A Little Help needed moving from lying on your back to sitting on the side of a flat bed without using bedrails?: A Little Help needed moving to and from a bed to a chair (including a wheelchair)?: A Little Help needed standing up from a chair using your arms (e.g., wheelchair or bedside chair)?: A Little Help needed to walk in hospital room?: A Little Help needed climbing 3-5 steps with a railing? : A Little 6 Click Score: 18    End of Session Equipment Utilized During Treatment: Gait belt Activity Tolerance: Patient tolerated treatment well Patient left: in chair;with call bell/phone within reach Nurse Communication: Mobility status PT Visit Diagnosis: Other abnormalities of gait and mobility (R26.89);Muscle weakness (generalized) (M62.81)     Time: 8681-8644 PT Time Calculation (min) (ACUTE ONLY): 37 min  Charges:    $Gait Training: 8-22 mins $Therapeutic Exercise: 8-22 mins $Therapeutic Activity: 8-22 mins PT General Charges $$ ACUTE PT VISIT: 1 Visit                     [facility] PT Acute Rehabilitation Services Office [phone]    Jarett Dralle 11/19/2024, 2:06 PM

## 2024-11-29 NOTE — Discharge Summary (Signed)
 Physician Discharge Summary   Patient ID: Denise Summers MRN: [id number] DOB/AGE: [birth year] 53 y.o.  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   Weight as of this encounter: 64.9 kg.  Procedure:  Procedures (LRB): ARTHROPLASTY, KNEE, TOTAL (Left)   Consults: None  HPI: see H&P Laboratory Data: Admission on 11/17/2024, Discharged on 11/19/2024  Component Date Value Ref Range Status   WBC 11/18/2024 8.9  4.0 - 10.5 K/uL Final   RBC 11/18/2024 3.27 (L)  3.87 - 5.11 MIL/uL Final   Hemoglobin 11/18/2024 10.5 (L)  12.0 - 15.0 g/dL Final   HCT 87/95/7974 32.5 (L)  36.0 - 46.0 % Final   MCV 11/18/2024 99.4  80.0 - 100.0 fL Final   MCH 11/18/2024 32.1  26.0 - 34.0 pg Final   MCHC 11/18/2024 32.3  30.0 - 36.0 g/dL Final   RDW 87/95/7974 13.1  11.5 - 15.5 % Final   Platelets 11/18/2024 230  150 - 400 K/uL Final   nRBC 11/18/2024 0.0  0.0 - 0.2 % Final   Performed at [facility], [address], [place], [place] [address]   Sodium 11/18/2024 137  135 - 145 mmol/L Final   Potassium 11/18/2024 4.5  3.5 - 5.1 mmol/L Final   Chloride 11/18/2024 104  98 - 111 mmol/L Final   CO2 11/18/2024 26  22 - 32 mmol/L Final   Glucose, Bld 11/18/2024 104 (H)  70 - 99 mg/dL Final   Glucose reference range applies only to samples taken after fasting for at least 8 hours.    BUN 11/18/2024 10  6 - 20 mg/dL Final   Creatinine, Ser 11/18/2024 0.69  0.44 - 1.00 mg/dL Final   Calcium 87/95/7974 8.3 (L)  8.9 - 10.3 mg/dL Final   GFR, Estimated 11/18/2024 >60  >60 mL/min Final   Comment: (NOTE) Calculated using the CKD-EPI Creatinine Equation (2021)    Anion gap 11/18/2024 7  5 - 15 Final   Performed at [facility], [address], [place], [place] [address]   WBC 11/19/2024 5.4  4.0 - 10.5 K/uL Final   RBC 11/19/2024 3.00 (L)  3.87 - 5.11 MIL/uL Final   Hemoglobin 11/19/2024 9.7 (L)  12.0 - 15.0 g/dL Final   HCT 87/94/7974 30.2 (L)  36.0 - 46.0 % Final   MCV 11/19/2024 100.7 (H)  80.0 - 100.0 fL Final   MCH 11/19/2024 32.3  26.0 - 34.0 pg Final   MCHC 11/19/2024 32.1  30.0 - 36.0 g/dL Final   RDW 87/94/7974 13.2  11.5 - 15.5 % Final   Platelets 11/19/2024 181  150 - 400 K/uL Final   nRBC 11/19/2024 0.0  0.0 - 0.2 % Final   Performed at [facility], [address], [place], [place] [address]   Sodium 11/19/2024 135  135 - 145 mmol/L Final   Potassium 11/19/2024 4.2  3.5 - 5.1 mmol/L Final   Chloride 11/19/2024 102  98 - 111 mmol/L Final   CO2 11/19/2024 28  22 - 32 mmol/L Final   Glucose, Bld 11/19/2024 109 (H)  70 - 99 mg/dL Final   Glucose reference range applies only to samples taken after fasting for at least 8 hours.   BUN 11/19/2024 8  6 - 20 mg/dL Final   Creatinine, Ser 11/19/2024 0.51  0.44 - 1.00 mg/dL Final   Calcium 87/94/7974 8.3 (L)  8.9 - 10.3 mg/dL Final   GFR, Estimated 11/19/2024 >60  >60 mL/min Final   Comment: (NOTE) Calculated using the CKD-EPI Creatinine Equation (2021)    Anion gap 11/19/2024 5  5 - 15 Final   Performed at [facility], [address], [place], [place] [address]  Hospital Outpatient Visit on 11/10/2024  Component Date Value Ref Range Status   Sodium 11/10/2024 139  135 - 145 mmol/L Final   Potassium 11/10/2024 4.6  3.5 - 5.1 mmol/L Final   Chloride  11/10/2024 103  98 - 111 mmol/L Final   CO2 11/10/2024 28  22 - 32 mmol/L Final   Glucose, Bld 11/10/2024 89  70 - 99 mg/dL Final   Glucose reference range applies only to samples taken after fasting for at least 8 hours.   BUN 11/10/2024 18  6 - 20 mg/dL Final   Creatinine, Ser 11/10/2024 0.76  0.44 - 1.00 mg/dL Final   Calcium 88/73/7974 9.1  8.9 - 10.3 mg/dL Final   GFR, Estimated 11/10/2024 >60  >60 mL/min Final   Comment: (NOTE) Calculated using the CKD-EPI Creatinine Equation (2021)    Anion gap 11/10/2024 8  5 - 15 Final   Performed at [facility], [address], [place], [place] [address]   WBC 11/10/2024 5.3  4.0 - 10.5 K/uL Final   RBC 11/10/2024 3.92  3.87 - 5.11 MIL/uL Final   Hemoglobin 11/10/2024 12.6  12.0 - 15.0 g/dL Final   HCT 88/73/7974 38.9  36.0 - 46.0 % Final   MCV 11/10/2024 99.2  80.0 - 100.0 fL Final   MCH 11/10/2024 32.1  26.0 - 34.0 pg Final   MCHC 11/10/2024 32.4  30.0 - 36.0 g/dL Final   RDW 88/73/7974 13.0  11.5 - 15.5 % Final   Platelets 11/10/2024 279  150 - 400 K/uL Final   nRBC 11/10/2024 0.0  0.0 - 0.2 % Final   Performed at [facility], [address] [place], [place] [address]   MRSA, PCR 11/10/2024 NEGATIVE  NEGATIVE Final   Staphylococcus aureus 11/10/2024 NEGATIVE  NEGATIVE Final   Comment: (NOTE) The Xpert SA Assay (FDA approved for NASAL specimens in patients 43 years of age and older), is one component of a comprehensive surveillance program. It is not intended to diagnose infection nor to guide or monitor treatment. Performed at [facility], [address] [place], [place] [address]      X-Rays:DG Knee 1-2 Views Left Result Date: 11/17/2024 CLINICAL DATA:  Status post left knee replacement. EXAM: LEFT KNEE - 1-2 VIEW COMPARISON:  None Available. FINDINGS: Left knee arthroplasty in expected alignment. No periprosthetic lucency or fracture. There has been patellar resurfacing.  Recent postsurgical change includes air and edema in the soft tissues and joint space. Anterior skin staples in place. IMPRESSION: Left knee arthroplasty without immediate postoperative complication. Electronically Signed   By: Andrea  Sanford M.D.   On: 11/17/2024 12:15    EKG: Orders placed or performed in visit on 10/05/15   EKG 12-Lead     Hospital Course: Denise Summers is a 53 y.o. who was admitted to [facility]. They were brought to the operating room on 11/17/2024 and underwent Procedures: ARTHROPLASTY, KNEE, TOTAL.  Patient tolerated the procedure well and was later transferred to the recovery room and then to the orthopaedic floor for postoperative care.  They were given PO and IV analgesics for pain control following their surgery.  They were given 24 hours of postoperative antibiotics of  Anti-infectives (From admission, onward)    Start     Dose/Rate Route Frequency Ordered Stop   11/17/24 1430  ceFAZolin  (ANCEF ) IVPB 2g/100 mL premix        2 g 200 mL/hr over 30 Minutes Intravenous Every 6 hours 11/17/24 1259 11/17/24 2050   11/17/24 0645  ceFAZolin  (ANCEF ) IVPB 2g/100 mL premix        2 g 200 mL/hr over 30 Minutes Intravenous On call to O.R. 11/17/24 9360 11/17/24 0839   11/17/24 0645  vancomycin  (VANCOCIN ) IVPB 1000 mg/200 mL premix        1,000 mg 200 mL/hr over 60 Minutes Intravenous On call to O.R. 11/17/24 9360 11/17/24 0810      and started on DVT prophylaxis in the form of Aspirin , TED hose, and SCDs.   PT and OT were ordered for total joint protocol.  Discharge planning consulted to help with postop disposition and equipment needs.  Patient had a fair night on the evening of surgery.  They started to get up OOB with therapy on day one. Continued to work with therapy into day two.  By day two, the patient had progressed with therapy and meeting their goals.  Incision was healing well.  Patient was seen in rounds and was ready to go home.   Diet: Regular  diet Activity:WBAT Follow-up:in 10-14 days Disposition - Home Discharged Condition: good   Discharge Instructions     Call MD / Call 911   Complete by: As directed    If you experience chest pain or shortness of breath, CALL 911 and be transported to the hospital emergency room.  If you develope a fever above 101 F, pus (white drainage) or increased drainage or redness at the wound, or calf pain, call your surgeon's office.   Constipation Prevention   Complete by: As directed    Drink plenty of fluids.  Prune juice may be helpful.  You may use a stool softener, such as Colace (over the counter) 100 mg twice a day.  Use MiraLax  (over the counter) for constipation as needed.   Diet - low sodium heart healthy   Complete by: As directed    Increase activity slowly as tolerated   Complete by: As directed    Post-operative opioid taper instructions:   Complete by: As directed    POST-OPERATIVE OPIOID TAPER INSTRUCTIONS: It is important to wean off of your opioid medication as soon as possible. If you do not need pain medication after your surgery it is ok to stop day one. Opioids include: Codeine, Hydrocodone (Norco, Vicodin), Oxycodone (Percocet, oxycontin ) and hydromorphone  amongst others.  Long term and even short term use of opiods can cause: Increased pain response Dependence Constipation Depression Respiratory depression And more.  Withdrawal symptoms can include Flu like symptoms Nausea, vomiting And more Techniques to manage these symptoms Hydrate well Eat regular healthy meals Stay  active Use relaxation techniques(deep breathing, meditating, yoga) Do Not substitute Alcohol to help with tapering If you have been on opioids for less than two weeks and do not have pain than it is ok to stop all together.  Plan to wean off of opioids This plan should start within one week post op of your joint replacement. Maintain the same interval or time between taking each dose and first  decrease the dose.  Cut the total daily intake of opioids by one tablet each day Next start to increase the time between doses. The last dose that should be eliminated is the evening dose.         Allergies as of 11/19/2024   No Known Allergies      Medication List     TAKE these medications    ALPRAZolam  0.25 MG tablet Commonly known as: XANAX  Take 0.25 mg by mouth 2 (two) times daily as needed.   aspirin  EC 81 MG tablet Take 1 tablet (81 mg total) by mouth 2 (two) times daily after a meal. Day after surgery   cholecalciferol  1000 units tablet Commonly known as: VITAMIN D  Take 1,000 Units by mouth daily.   cyanocobalamin  1000 MCG/ML injection Commonly known as: VITAMIN B12 Inject 1 mL into the skin every 30 (thirty) days.   docusate sodium  100 MG capsule Commonly known as: Colace Take 1 capsule (100 mg total) by mouth 2 (two) times daily.   methocarbamol  500 MG tablet Commonly known as: ROBAXIN  Take 1 tablet (500 mg total) by mouth every 8 (eight) hours as needed for muscle spasms.   multivitamin with minerals Tabs tablet Take 1 tablet by mouth daily.   oxyCODONE  5 MG immediate release tablet Commonly known as: Oxy IR/ROXICODONE  Take 1 tablet (5 mg total) by mouth every 4 (four) hours as needed for severe pain (pain score 7-10).   pantoprazole  40 MG tablet Commonly known as: PROTONIX  Take 40 mg by mouth daily.   polyethylene glycol 17 g packet Commonly known as: MIRALAX  / GLYCOLAX  Take 17 g by mouth daily.        Follow-up Information     Duwayne Purchase, MD Follow up in 2 week(s).   Specialty: Orthopedic Surgery Contact information: [address] [place] [address] [phone]         [facility] Follow up.   Specialty: Home Health Services Why: to provide home physical therapy visits Contact information: [address] [place] [address] [phone]                 Signed: Darice Randy PA-C Orthopaedic Surgery 11/29/2024, 11:36 AM

## 2025-01-11 ENCOUNTER — Other Ambulatory Visit: Payer: Self-pay | Admitting: Obstetrics and Gynecology

## 2025-01-11 DIAGNOSIS — Z1231 Encounter for screening mammogram for malignant neoplasm of breast: Secondary | ICD-10-CM

## 2025-03-02 ENCOUNTER — Ambulatory Visit

## 2025-03-09 ENCOUNTER — Ambulatory Visit: Admitting: Obstetrics and Gynecology
